# Patient Record
Sex: Female | Born: 1945 | Race: White | Hispanic: No | Marital: Married | Smoking: Never smoker
Health system: Southern US, Community
[De-identification: ages and names within clinical notes are randomized; demographics above are authoritative.]

## PROBLEM LIST (undated history)

## (undated) DIAGNOSIS — E785 Hyperlipidemia, unspecified: Secondary | ICD-10-CM

## (undated) DIAGNOSIS — I2510 Atherosclerotic heart disease of native coronary artery without angina pectoris: Secondary | ICD-10-CM

## (undated) DIAGNOSIS — I10 Essential (primary) hypertension: Secondary | ICD-10-CM

## (undated) DIAGNOSIS — N200 Calculus of kidney: Secondary | ICD-10-CM

## (undated) DIAGNOSIS — Z87442 Personal history of urinary calculi: Secondary | ICD-10-CM

## (undated) DIAGNOSIS — E119 Type 2 diabetes mellitus without complications: Secondary | ICD-10-CM

## (undated) HISTORY — PX: CARDIAC CATHETERIZATION: SHX172

## (undated) HISTORY — PX: LITHOTRIPSY: SUR834

---
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
## 1982-11-17 HISTORY — PX: TUBAL LIGATION: SHX77

## 2014-11-17 DIAGNOSIS — E119 Type 2 diabetes mellitus without complications: Secondary | ICD-10-CM | POA: Insufficient documentation

## 2014-11-17 HISTORY — DX: Type 2 diabetes mellitus without complications: E11.9

## 2019-11-13 ENCOUNTER — Encounter: Payer: Self-pay | Admitting: Cardiology

## 2019-11-13 DIAGNOSIS — E871 Hypo-osmolality and hyponatremia: Secondary | ICD-10-CM | POA: Diagnosis not present

## 2019-11-13 DIAGNOSIS — I340 Nonrheumatic mitral (valve) insufficiency: Secondary | ICD-10-CM | POA: Diagnosis not present

## 2019-11-13 DIAGNOSIS — R509 Fever, unspecified: Secondary | ICD-10-CM | POA: Diagnosis not present

## 2019-11-13 DIAGNOSIS — M6281 Muscle weakness (generalized): Secondary | ICD-10-CM | POA: Diagnosis not present

## 2019-11-13 DIAGNOSIS — E119 Type 2 diabetes mellitus without complications: Secondary | ICD-10-CM | POA: Diagnosis not present

## 2019-11-13 DIAGNOSIS — I214 Non-ST elevation (NSTEMI) myocardial infarction: Secondary | ICD-10-CM | POA: Diagnosis not present

## 2019-11-14 DIAGNOSIS — E871 Hypo-osmolality and hyponatremia: Secondary | ICD-10-CM | POA: Diagnosis not present

## 2019-11-14 DIAGNOSIS — M6281 Muscle weakness (generalized): Secondary | ICD-10-CM | POA: Diagnosis not present

## 2019-11-14 DIAGNOSIS — E785 Hyperlipidemia, unspecified: Secondary | ICD-10-CM | POA: Diagnosis not present

## 2019-11-14 DIAGNOSIS — R778 Other specified abnormalities of plasma proteins: Secondary | ICD-10-CM | POA: Diagnosis not present

## 2019-11-14 DIAGNOSIS — I10 Essential (primary) hypertension: Secondary | ICD-10-CM | POA: Diagnosis not present

## 2019-11-14 DIAGNOSIS — R509 Fever, unspecified: Secondary | ICD-10-CM | POA: Diagnosis not present

## 2019-11-14 DIAGNOSIS — I214 Non-ST elevation (NSTEMI) myocardial infarction: Secondary | ICD-10-CM | POA: Diagnosis not present

## 2019-11-14 DIAGNOSIS — E119 Type 2 diabetes mellitus without complications: Secondary | ICD-10-CM | POA: Diagnosis not present

## 2019-11-15 ENCOUNTER — Ambulatory Visit (HOSPITAL_COMMUNITY)
Admission: AD | Admit: 2019-11-15 | Payer: Self-pay | Source: Other Acute Inpatient Hospital | Admitting: Interventional Cardiology

## 2019-11-15 ENCOUNTER — Encounter (HOSPITAL_COMMUNITY): Payer: Self-pay | Admitting: Family Medicine

## 2019-11-15 ENCOUNTER — Inpatient Hospital Stay (HOSPITAL_COMMUNITY)
Admission: AD | Admit: 2019-11-15 | Discharge: 2019-11-19 | DRG: 871 | Disposition: A | Discharge status: Home Health | Payer: Medicare Other | Source: Other Acute Inpatient Hospital | Attending: Osteopathic Medicine | Admitting: Osteopathic Medicine

## 2019-11-15 DIAGNOSIS — I252 Old myocardial infarction: Secondary | ICD-10-CM

## 2019-11-15 DIAGNOSIS — I214 Non-ST elevation (NSTEMI) myocardial infarction: Secondary | ICD-10-CM

## 2019-11-15 DIAGNOSIS — Z20822 Contact with and (suspected) exposure to covid-19: Secondary | ICD-10-CM | POA: Diagnosis present

## 2019-11-15 DIAGNOSIS — E119 Type 2 diabetes mellitus without complications: Secondary | ICD-10-CM | POA: Diagnosis not present

## 2019-11-15 DIAGNOSIS — A419 Sepsis, unspecified organism: Secondary | ICD-10-CM | POA: Diagnosis not present

## 2019-11-15 DIAGNOSIS — Z8249 Family history of ischemic heart disease and other diseases of the circulatory system: Secondary | ICD-10-CM | POA: Diagnosis not present

## 2019-11-15 DIAGNOSIS — N202 Calculus of kidney with calculus of ureter: Secondary | ICD-10-CM | POA: Diagnosis present

## 2019-11-15 DIAGNOSIS — N136 Pyonephrosis: Secondary | ICD-10-CM | POA: Diagnosis present

## 2019-11-15 DIAGNOSIS — E785 Hyperlipidemia, unspecified: Secondary | ICD-10-CM | POA: Diagnosis present

## 2019-11-15 DIAGNOSIS — I2510 Atherosclerotic heart disease of native coronary artery without angina pectoris: Secondary | ICD-10-CM | POA: Diagnosis not present

## 2019-11-15 DIAGNOSIS — E1169 Type 2 diabetes mellitus with other specified complication: Secondary | ICD-10-CM

## 2019-11-15 DIAGNOSIS — E871 Hypo-osmolality and hyponatremia: Secondary | ICD-10-CM | POA: Diagnosis present

## 2019-11-15 DIAGNOSIS — I10 Essential (primary) hypertension: Secondary | ICD-10-CM | POA: Diagnosis present

## 2019-11-15 DIAGNOSIS — I493 Ventricular premature depolarization: Secondary | ICD-10-CM | POA: Diagnosis present

## 2019-11-15 DIAGNOSIS — M6281 Muscle weakness (generalized): Secondary | ICD-10-CM | POA: Diagnosis not present

## 2019-11-15 DIAGNOSIS — R778 Other specified abnormalities of plasma proteins: Secondary | ICD-10-CM

## 2019-11-15 DIAGNOSIS — R509 Fever, unspecified: Secondary | ICD-10-CM | POA: Diagnosis not present

## 2019-11-15 DIAGNOSIS — N132 Hydronephrosis with renal and ureteral calculous obstruction: Secondary | ICD-10-CM

## 2019-11-15 DIAGNOSIS — N200 Calculus of kidney: Secondary | ICD-10-CM | POA: Diagnosis not present

## 2019-11-15 DIAGNOSIS — R8271 Bacteriuria: Secondary | ICD-10-CM | POA: Diagnosis not present

## 2019-11-15 HISTORY — DX: Type 2 diabetes mellitus with other specified complication: E11.69

## 2019-11-15 HISTORY — DX: Hyperlipidemia, unspecified: E78.5

## 2019-11-15 HISTORY — DX: Sepsis, unspecified organism: A41.9

## 2019-11-15 HISTORY — DX: Hypo-osmolality and hyponatremia: E87.1

## 2019-11-15 HISTORY — DX: Hydronephrosis with renal and ureteral calculous obstruction: N13.2

## 2019-11-15 HISTORY — DX: Calculus of kidney: N20.0

## 2019-11-15 HISTORY — DX: Essential (primary) hypertension: I10

## 2019-11-15 HISTORY — DX: Old myocardial infarction: I25.2

## 2019-11-15 LAB — COMPREHENSIVE METABOLIC PANEL WITH GFR
ALT: 39 U/L (ref 0–44)
AST: 41 U/L (ref 15–41)
Albumin: 1.9 g/dL — ABNORMAL LOW (ref 3.5–5.0)
Alkaline Phosphatase: 70 U/L (ref 38–126)
Anion gap: 7 (ref 5–15)
BUN: 18 mg/dL (ref 8–23)
CO2: 21 mmol/L — ABNORMAL LOW (ref 22–32)
Calcium: 7.9 mg/dL — ABNORMAL LOW (ref 8.9–10.3)
Chloride: 107 mmol/L (ref 98–111)
Creatinine, Ser: 1.05 mg/dL — ABNORMAL HIGH (ref 0.44–1.00)
GFR calc Af Amer: >60 mL/min
GFR calc non Af Amer: 53 mL/min — ABNORMAL LOW
Glucose, Bld: 148 mg/dL — ABNORMAL HIGH (ref 70–99)
Potassium: 4.1 mmol/L (ref 3.5–5.1)
Sodium: 135 mmol/L (ref 135–145)
Total Bilirubin: 0.3 mg/dL (ref 0.3–1.2)
Total Protein: 5.7 g/dL — ABNORMAL LOW (ref 6.5–8.1)

## 2019-11-15 LAB — PROTIME-INR
INR: 1.1 (ref 0.8–1.2)
Prothrombin Time: 14.1 s (ref 11.4–15.2)

## 2019-11-15 LAB — CBC
HCT: 29.7 % — ABNORMAL LOW (ref 36.0–46.0)
Hemoglobin: 9.6 g/dL — ABNORMAL LOW (ref 12.0–15.0)
MCH: 29 pg (ref 26.0–34.0)
MCHC: 32.3 g/dL (ref 30.0–36.0)
MCV: 89.7 fL (ref 80.0–100.0)
Platelets: 329 10*3/uL (ref 150–400)
RBC: 3.31 MIL/uL — ABNORMAL LOW (ref 3.87–5.11)
RDW: 13.2 % (ref 11.5–15.5)
WBC: 11.6 10*3/uL — ABNORMAL HIGH (ref 4.0–10.5)
nRBC: 0 % (ref 0.0–0.2)

## 2019-11-15 LAB — GLUCOSE, CAPILLARY: Glucose-Capillary: 154 mg/dL — ABNORMAL HIGH (ref 70–99)

## 2019-11-15 LAB — HEMOGLOBIN A1C
Hgb A1c MFr Bld: 7.3 % — ABNORMAL HIGH (ref 4.8–5.6)
Mean Plasma Glucose: 162.81 mg/dL

## 2019-11-15 LAB — SARS CORONAVIRUS 2 (TAT 6-24 HRS): SARS Coronavirus 2: NEGATIVE

## 2019-11-15 SURGERY — LEFT HEART CATH AND CORONARY ANGIOGRAPHY
Anesthesia: LOCAL

## 2019-11-15 MED ORDER — SODIUM CHLORIDE 0.9 % IV SOLN: INTRAVENOUS | Status: DC

## 2019-11-15 MED ORDER — ACETAMINOPHEN 650 MG RE SUPP: 650.0000 mg | Freq: Four times a day (QID) | RECTAL | Status: DC | PRN

## 2019-11-15 MED ORDER — POLYETHYLENE GLYCOL 3350 17 G PO PACK: 17.0000 g | PACK | Freq: Every day | ORAL | Status: DC | PRN

## 2019-11-15 MED ORDER — SODIUM CHLORIDE 0.9 % IV SOLN
1.0000 g | INTRAVENOUS | Status: DC
  Administered 2019-11-16 – 2019-11-19 (×4): 1 g via INTRAVENOUS
  Filled 2019-11-15 (×2): qty 10
  Filled 2019-11-15: qty 1
  Filled 2019-11-15: qty 10

## 2019-11-15 MED ORDER — ASPIRIN EC 81 MG PO TBEC
81.0000 mg | DELAYED_RELEASE_TABLET | Freq: Every day | ORAL | Status: DC
  Administered 2019-11-15 – 2019-11-19 (×5): 81 mg via ORAL
  Filled 2019-11-15 (×5): qty 1

## 2019-11-15 MED ORDER — INSULIN ASPART 100 UNIT/ML ~~LOC~~ SOLN
0.0000 [IU] | Freq: Three times a day (TID) | SUBCUTANEOUS | Status: DC
  Administered 2019-11-16 (×2): 1 [IU] via SUBCUTANEOUS
  Administered 2019-11-17: 2 [IU] via SUBCUTANEOUS
  Administered 2019-11-17 – 2019-11-18 (×2): 1 [IU] via SUBCUTANEOUS
  Administered 2019-11-18 – 2019-11-19 (×2): 3 [IU] via SUBCUTANEOUS
  Administered 2019-11-19: 1 [IU] via SUBCUTANEOUS

## 2019-11-15 MED ORDER — ONDANSETRON HCL 4 MG/2ML IJ SOLN: 4.0000 mg | Freq: Four times a day (QID) | INTRAMUSCULAR | Status: DC | PRN

## 2019-11-15 MED ORDER — ACETAMINOPHEN 325 MG PO TABS
650.0000 mg | ORAL_TABLET | Freq: Four times a day (QID) | ORAL | Status: DC | PRN
  Administered 2019-11-15 – 2019-11-16 (×2): 650 mg via ORAL
  Filled 2019-11-15 (×2): qty 2

## 2019-11-15 MED ORDER — ONDANSETRON HCL 4 MG PO TABS: 4.0000 mg | ORAL_TABLET | Freq: Four times a day (QID) | ORAL | Status: DC | PRN

## 2019-11-15 MED ORDER — HEPARIN (PORCINE) 25000 UT/250ML-% IV SOLN
1200.0000 [IU]/h | INTRAVENOUS | Status: DC
  Administered 2019-11-15: 1000 [IU]/h via INTRAVENOUS
  Filled 2019-11-15: qty 250

## 2019-11-15 MED ORDER — ATORVASTATIN CALCIUM 10 MG PO TABS
20.0000 mg | ORAL_TABLET | Freq: Every day | ORAL | Status: DC
  Administered 2019-11-16: 20 mg via ORAL
  Filled 2019-11-15: qty 2

## 2019-11-15 NOTE — H&P (Addendum)
History and Physical    Claudia Christian GTX:[id number] DOB: 1945-12-24 DOA: 11/15/2019  PCP: Patient, No Pcp Per Patient coming from: [facility]  Chief Complaint: NSTEMI  HPI: Claudia Christian is a 73 y.o. female with medical history significant of diabetes, hypertension, hyperlipidemia, nephrolithiasis status post stenting and lithotripsy.  Patient presented initially to the outside hospital secondary to significant weakness and fatigue associated with fever.  Symptoms started about 3 days ago.  Patient had her husband help her in normal was called for transfer to the emergency department for persistent significant weakness.  She reports having a fever up to 104F.  Outside hospital course (personally reviewed): Patient was admitted for concern for sepsis in the setting of unknown source.  Patient was tested for Covid 19 twice with negative results.  Patient also found to have elevated troponin while inpatient concerning for NSTEMI.  CTA chest was negative for PE and significant for CAD.  Patient also noted to have small bilateral pleural effusions with possible Eraxis of right lung base. Patient underwent CT urogram which was significant for moderate to severe left-sided hydro and ureteral nephrosis secondary to obstructing 1 x 0.6 cm stone at the proximal left ureter with additional nonobstructing stones seen in the lower pole of the left kidney measuring up to 1.7 cm. Patient on Ceftriaxone  Labs dated 11/14/19 significant for: CBC: WBC 11.8, hemoglobin 9.8, hematocrit 29.4, platelets 271 BMP: Sodium 126, potassium 3.2, chloride 99, CO2 18, BUN 15, creatinine 1.0, glucose 175 Troponin trend of 2.02 at 8:20 AM, troponin of 1.69 at 12:20 PM, troponin of 1.37 at p.m. Osmolality of 248, calcium of 7.9, LDL cholesterol of 39.8, HDL of 14, INR 1.2, procalcitonin of 0.41  Patient medications include: Enalapril 20 mg daily, sertraline 50 mg every morning, Crestor 10 mg nightly, metformin 1000  mg p.o. twice daily, Januvia 25 mg p.o. daily, alendronate sodium 70 mg p.o. Thursday  Review of Systems: Review of Systems  Constitutional: Positive for fever and malaise/fatigue.  Respiratory: Positive for shortness of breath. Negative for cough and sputum production.   Cardiovascular: Negative for chest pain and palpitations.  Gastrointestinal: Positive for diarrhea. Negative for constipation, nausea and vomiting.  Neurological: Positive for weakness.  All other systems reviewed and are negative.   Past Medical History:  Diagnosis Date  . Diabetes mellitus type 2, controlled (HCC)   . Essential hypertension   . Hyperlipidemia   . Nephrolithiasis     Past Surgical History:  Procedure Laterality Date  . LITHOTRIPSY    . TUBAL LIGATION  1984     reports that she has never smoked. She has never used smokeless tobacco. She reports that she does not drink alcohol or use drugs.  No Known Allergies  Family History  Problem Relation Age of Onset  . Heart attack Mother   . Heart attack Father   . Heart attack Brother    Prior to Admission medications   Not on File    Physical Exam:  Physical Exam Constitutional:      General: She is not in acute distress.    Appearance: She is well-developed. She is not diaphoretic.  Eyes:     Conjunctiva/sclera: Conjunctivae normal.     Pupils: Pupils are equal, round, and reactive to light.  Cardiovascular:     Rate and Rhythm: Normal rate and regular rhythm.     Heart sounds: Normal heart sounds. No murmur.  Pulmonary:     Effort: Pulmonary effort is normal. No respiratory distress.  Breath sounds: Normal breath sounds. No wheezing or rales.  Abdominal:     General: Bowel sounds are normal. There is no distension.     Palpations: Abdomen is soft.     Tenderness: There is no abdominal tenderness. There is no right CVA tenderness, left CVA tenderness, guarding or rebound.  Musculoskeletal:        General: No tenderness. Normal  range of motion.     Cervical back: Normal range of motion.  Lymphadenopathy:     Cervical: No cervical adenopathy.  Skin:    General: Skin is warm and dry.  Neurological:     Mental Status: She is alert and oriented to person, place, and time.     Labs on Admission: I have personally reviewed following labs and imaging studies  CBC: No results for input(s): WBC, NEUTROABS, HGB, HCT, MCV, PLT in the last 168 hours.  Basic Metabolic Panel: No results for input(s): NA, K, CL, CO2, GLUCOSE, BUN, CREATININE, CALCIUM, MG, PHOS in the last 168 hours.  GFR: CrCl cannot be calculated (No successful lab value found.).  Liver Function Tests: No results for input(s): AST, ALT, ALKPHOS, BILITOT, PROT, ALBUMIN in the last 168 hours. No results for input(s): LIPASE, AMYLASE in the last 168 hours. No results for input(s): AMMONIA in the last 168 hours.  Coagulation Profile: No results for input(s): INR, PROTIME in the last 168 hours.  Cardiac Enzymes: No results for input(s): CKTOTAL, CKMB, CKMBINDEX, TROPONINI in the last 168 hours.  BNP (last 3 results) No results for input(s): PROBNP in the last 8760 hours.  HbA1C: No results for input(s): HGBA1C in the last 72 hours.  CBG: No results for input(s): GLUCAP in the last 168 hours.  Lipid Profile: No results for input(s): CHOL, HDL, LDLCALC, TRIG, CHOLHDL, LDLDIRECT in the last 72 hours.  Thyroid Function Tests: No results for input(s): TSH, T4TOTAL, FREET4, T3FREE, THYROIDAB in the last 72 hours.  Anemia Panel: No results for input(s): VITAMINB12, FOLATE, FERRITIN, TIBC, IRON, RETICCTPCT in the last 72 hours.  Urine analysis: No results found for: COLORURINE, APPEARANCEUR, LABSPEC, PHURINE, GLUCOSEU, HGBUR, BILIRUBINUR, KETONESUR, PROTEINUR, UROBILINOGEN, NITRITE, LEUKOCYTESUR   Radiological Exams on Admission: No results found.  Assessment/Plan Principal Problem:   Sepsis ([place]) Active Problems:   Hydronephrosis with  renal and ureteral calculus obstruction   Hyponatremia   NSTEMI (non-ST elevated myocardial infarction) ([place])   Type 2 diabetes mellitus with hyperlipidemia ([place])   Sepsis Present on admission from outside hospital.  Physiology is improved.  Initially without source but now concerned to have evidence of possible abscess.  Last temperature of 98.1 F prior to arrival 3 months from hospital. Blood cultures no growth to date. COVID-19 tested twice with negative results. -Continue Ceftriaxone IV  NSTEMI No associated chest pain.  Peak troponin I of 2.02.  EKG from outside hospital significant for nonspecific T wave changes located in the anterior leads.  PVCs noted. [facility]iology at [facility] recommended possible heart catheterization.  Cardiology was consulted and will see during admission.  Patient was started on heparin drip. seen  -Consult cardiology: Transthoracic Echocardiogram and will see -Continue heparin drip  Hydronephrosis L . obstructing nephrolithiasis Patient has a history of obstructive nephrolithiasis requiring stent and lithotripsy. Has a urologist in [place]. Consulted urology who recommends nephrostomy tube -IR consult placed -NPO  Hyponatremia Unknown etiology.  Patient was being treated with normal saline at outside hospital.  Last sodium of 126.  Associated low serum osmolality. -Resume normal saline, check  BMP this evening and tomorrow morning  Diabetes mellitus, type 2 with hyperlipidemia Patient is on metformin as an outpatient. -Hold Metformin -Sliding scale insulin while inpatient  Generalized weakness -Physical therapy consult   DVT prophylaxis: Heparin drip Code Status: Full code Family Communication: None at bedside Disposition Plan: Discharge pending continued work-up and management Consults called: Urology, cardiology Admission status: Inpatient   Jacquelin Hawking, MD [facility] 11/15/2019, 5:17 PM

## 2019-11-15 NOTE — Progress Notes (Signed)
Received from [facility] via ambulance. Monitor placed and verified. Oriented to room and unit. Call bell and phone in reach. Hospitalist to see.

## 2019-11-15 NOTE — Consult Note (Signed)
Reason for Consult Large Left Ureteral + Partial Staghorn Claudia Christian  Referring Physician: Jodean Lima MD  Claudia Christian is an 73 y.o. female.   HPI:   1 - Recurrent Urolithiasis -  Pre 2020 - URS x 1, SWL x 1 in [place]  10/2019 Large Left Ureteral + Partial Staghorn Stone - 1cm left UPJ + 3cm lower pole stones (1100HU) by CT at [facility] 11/14/19.   2 -  Bacteruria  - bacteruria with some concern of mild pyelo per report from referring hospital. Placed in empiric rocephin. No CX data for review. She is without high fevers, non-tachycardic here.  PMH sig for BTL, DM2. NO baseline functional limitations from health, independent in all ADL's lives with husband.   Today " Claudia Christian " is seen in consultation for above. She was transferred here from [place] without any a priori input from Urology. Also some question of NSTEMI.    Past Medical History:  Diagnosis Date  . Diabetes mellitus type 2, controlled (HCC)   . Essential hypertension   . Hyperlipidemia   . Nephrolithiasis     Past Surgical History:  Procedure Laterality Date  . LITHOTRIPSY    . TUBAL LIGATION  1984    Family History  Problem Relation Age of Onset  . Heart attack Mother   . Heart attack Father   . Heart attack Brother     Social History:  reports that she has never smoked. She has never used smokeless tobacco. She reports that she does not drink alcohol or use drugs.  Allergies: No Known Allergies  Medications: I have reviewed the patient's current medications.  No results found for this or any previous visit (from the past 48 hour(s)).  No results found.  Review of Systems  Constitutional: Positive for chills and fever.  HENT: Negative.   Eyes: Negative.   Respiratory: Negative.   Cardiovascular: Negative.   Gastrointestinal: Negative.   Endocrine: Negative.   Genitourinary: Negative.   Musculoskeletal: Negative.   Allergic/Immunologic: Negative.   Neurological: Negative.    Hematological: Negative.   Psychiatric/Behavioral: Negative.    Blood pressure 129/74, pulse 85, temperature 99.3 F (37.4 C), temperature source Oral, resp. rate 16, height 5\' 4"  (1.626 m), weight 74.3 kg, SpO2 99 %. Physical Exam  Constitutional: She is oriented to person, place, and time. She appears well-developed.  Very pleasant. NO distress at all.   HENT:  Head: Normocephalic.  Eyes: Pupils are equal, round, and reactive to light.  Cardiovascular: Normal rate.  Respiratory: Effort normal.  GI: Soft.  Genitourinary:    Genitourinary Comments: NO CVAT at present.    Musculoskeletal:        General: Normal range of motion.     Cervical back: Normal range of motion.  Neurological: She is alert and oriented to person, place, and time.  Skin: Skin is warm.  Psychiatric: She has a normal mood and affect.    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   Have discussed rec with pt and primary team. Greatly appreciate hospitalist comanagment.   Pt voiced understanding and agreement with plan.  2 -  Bacteruria - agree with current rocephin pending furhter CX data. She has virtually no SIRS criteria and is not septic.    11/15/2019, 6:21 PM

## 2019-11-15 NOTE — Progress Notes (Signed)
ANTICOAGULATION CONSULT NOTE - Initial Consult  Pharmacy Consult for heparin Indication: chest pain/ACS  No Known Allergies  Patient Measurements: Height: 5\' 4"  (162.6 cm) Weight: 163 lb 11.2 oz (74.3 kg) IBW/kg (Calculated) : 54.7 Heparin Dosing Weight: 70kg Vital Signs: Temp: 99.3 F (37.4 C) (12/29 1713) Temp Source: Oral (12/29 1713) BP: 129/74 (12/29 1713) Pulse Rate: 85 (12/29 1713)  Labs: No results for input(s): HGB, HCT, PLT, APTT, LABPROT, INR, HEPARINUNFRC, HEPRLOWMOCWT, CREATININE, CKTOTAL, CKMB, TROPONINIHS in the last 72 hours.  CrCl cannot be calculated (No successful lab value found.).   Medical History: Past Medical History:  Diagnosis Date  . Diabetes mellitus type 2, controlled ([place])   . Essential hypertension   . Hyperlipidemia   . Nephrolithiasis     Assessment: 73 year old female transferred from [facility] hospital to [facility] for further evaluation including IR, cardiology, and urology.   Patient with elevated cardiac enzymes at [facility], she was receiving full dose lovenox, appears last dose was this morning. New orders received to transition to IV heparin, will start tonight without bolus.   Hgb 9.8, plt 271 Does not appear that she was on anticoagulation prior to admit.   Goal of Therapy:  Heparin level 0.3-0.7 units/ml Monitor platelets by anticoagulation protocol: Yes   Plan:  Start heparin infusion at 1000 units/hr Check anti-Xa level in 8 hours and daily while on heparin Continue to monitor H&H and platelets   Erin Hearing PharmD., BCPS Clinical Pharmacist 11/15/2019 6:12 PM

## 2019-11-15 NOTE — Consult Note (Signed)
Cardiology Consultation:   Patient ID: Claudia Christian MRN: [id number]; DOB: 1946-02-27  Admit date: 11/15/2019 Date of Consult: 11/15/2019  Primary Care Provider: Patient, No Pcp Per Primary Cardiologist: New Primary Electrophysiologist:  None    Patient Profile:   Claudia Christian is a 73 y.o. female with a hx of  DM2, HTN, HL, kidney stones with prior interventions admitted to [facility] with weakness and fatigue, fever.Reported home temps 104 who is being seen today for the evaluation of elevated troponin at the request of Dr Lonny Prude.  History of Present Illness:   [name] 73 yo female history of DM2, HTN, HL, kidney stones with prior interventions admitted to [facility] with weakness and fatigue, fever.Reported home temps 104. She denies any chest pain. On my history she denies any SOB. Said she was feeling well up until Christmas, then progressively started developing significant fatigue, weakness, and fevers.   At OSH negative COVID testing.  CT PE negative for PE, +coronary atherosclerosis CT urogram: moderate to severe left hydronephrosis and ureteral nephrosis due to obstructing stone EKG SR, no acute ischemic changes Echo 11/13/19 [place]: LVEF 27-25%, grade I diastolic dysfunction, apical lateral wall is hypokinetic,   WBC 11.8 Hgb 9.8 Plt 271 Na 126 K 3.2 BUN 15 Cr 1  Trop 2.02-->1.69-->1.37    Heart Pathway Score:     Past Medical History:  Diagnosis Date  . Diabetes mellitus type 2, controlled ([place])   . Essential hypertension   . Hyperlipidemia   . Nephrolithiasis     Past Surgical History:  Procedure Laterality Date  . LITHOTRIPSY    . TUBAL LIGATION  1984      Inpatient Medications: Scheduled Meds: . [START ON 11/16/2019] insulin aspart  0-9 Units Subcutaneous TID WC   Continuous Infusions: . sodium chloride    . [START ON 11/16/2019] cefTRIAXone (ROCEPHIN)  IV    . heparin     PRN Meds: acetaminophen **OR** acetaminophen,  ondansetron **OR** ondansetron (ZOFRAN) IV, polyethylene glycol  Allergies:   No Known Allergies  Social History:   Social History   Socioeconomic History  . Marital status: Married    Spouse name: Not on file  . Number of children: Not on file  . Years of education: Not on file  . Highest education level: Not on file  Occupational History  . Not on file  Tobacco Use  . Smoking status: Never Smoker  . Smokeless tobacco: Never Used  Substance and Sexual Activity  . Alcohol use: Never  . Drug use: Never  . Sexual activity: Yes    Partners: Male  Other Topics Concern  . Not on file  Social History Narrative  . Not on file   Social Determinants of Health   Financial Resource Strain:   . Difficulty of Paying Living Expenses: Not on file  Food Insecurity:   . Worried About Charity fundraiser in the Last Year: Not on file  . Ran Out of Food in the Last Year: Not on file  Transportation Needs:   . Lack of Transportation (Medical): Not on file  . Lack of Transportation (Non-Medical): Not on file  Physical Activity:   . Days of Exercise per Week: Not on file  . Minutes of Exercise per Session: Not on file  Stress:   . Feeling of Stress : Not on file  Social Connections:   . Frequency of Communication with Friends and Family: Not on file  . Frequency of Social Gatherings with Friends and  Family: Not on file  . Attends Religious Services: Not on file  . Active Member of Clubs or Organizations: Not on file  . Attends Banker Meetings: Not on file  . Marital Status: Not on file  Intimate Partner Violence:   . Fear of Current or Ex-Partner: Not on file  . Emotionally Abused: Not on file  . Physically Abused: Not on file  . Sexually Abused: Not on file    Family History:    Family History  Problem Relation Age of Onset  . Heart attack Mother   . Heart attack Father   . Heart attack Brother      ROS:  Please see the history of present illness.  All  other ROS reviewed and negative.     Physical Exam/Data:   Vitals:   11/15/19 1713  BP: 129/74  Pulse: 85  Resp: 16  Temp: 99.3 F (37.4 C)  TempSrc: Oral  SpO2: 99%  Weight: 74.3 kg  Height: 5\' 4"  (1.626 m)   No intake or output data in the 24 hours ending 11/15/19 1839 Last 3 Weights 11/15/2019  Weight (lbs) 163 lb 11.2 oz  Weight (kg) 74.254 kg     Body mass index is 28.1 kg/m.  General:  Well nourished, well developed, in no acute distress HEENT: normal Lymph: no adenopathy Neck: no JVD Endocrine:  No thryomegaly Cardiac:  normal S1, S2; RRR; no murmur  Lungs:  clear to auscultation bilaterally, no wheezing, rhonchi or rales  Abd: soft, nontender, no hepatomegaly  Ext: no edema Musculoskeletal:  No deformities, BUE and BLE strength normal and equal Skin: warm and dry  Neuro:  CNs 2-12 intact, no focal abnormalities noted Psych:  Normal affect     Laboratory Data:  High Sensitivity Troponin:  No results for input(s): TROPONINIHS in the last 720 hours.   ChemistryNo results for input(s): NA, K, CL, CO2, GLUCOSE, BUN, CREATININE, CALCIUM, GFRNONAA, GFRAA, ANIONGAP in the last 168 hours.  No results for input(s): PROT, ALBUMIN, AST, ALT, ALKPHOS, BILITOT in the last 168 hours. HematologyNo results for input(s): WBC, RBC, HGB, HCT, MCV, MCH, MCHC, RDW, PLT in the last 168 hours. BNPNo results for input(s): BNP, PROBNP in the last 168 hours.  DDimer No results for input(s): DDIMER in the last 168 hours.   Radiology/Studies:  No results found. {   Assessment and Plan:   1. Sepsis - per primary team, from notes unclear source   2. Hydronephrosis - from H&P urology is planning a nephrostomy tube  3. Elevated troponin -difficult interpretation of elevated troponin in setting of sepsis without any cardiopulmonary symptoms or context. Physically active at home prior to current illness, denies any SOB or chest pain, admitted with fever and fatigue/weakness.  -  elevated peak 2 by standard trop, trending down. - CT PE showed some coronary calcifications, by echo report normal LVEF but apical lateral hypokinesis. EKG without specific ischemic changes. No specific symptoms.   -  Patient requires nephrostomy tube placement, much higher priority than consideration cath at this time. Needs further resolution of her sepsis - potential cath vs noninvasive evaluation once other acute medical issues are improved. Given absence of cardiopulmonary symptoms likely would plan for noninvasive testing.  - continue medical therapy with ASA, hep gtt, low dose beta blocker. No ACE given urinary obstruction and risk for AKI.      For questions or updates, please contact [facility] Please consult [email or web address] for contact info under  Joanie Coddington, MD  11/15/2019 6:39 PM

## 2019-11-16 ENCOUNTER — Inpatient Hospital Stay (HOSPITAL_COMMUNITY): Payer: Medicare Other

## 2019-11-16 ENCOUNTER — Other Ambulatory Visit: Payer: Self-pay

## 2019-11-16 ENCOUNTER — Other Ambulatory Visit (HOSPITAL_COMMUNITY): Payer: Medicare Other

## 2019-11-16 DIAGNOSIS — N200 Calculus of kidney: Secondary | ICD-10-CM

## 2019-11-16 HISTORY — PX: IR NEPHROSTOMY PLACEMENT LEFT: IMG6063

## 2019-11-16 LAB — CBC
HCT: 29.9 % — ABNORMAL LOW (ref 36.0–46.0)
Hemoglobin: 9.8 g/dL — ABNORMAL LOW (ref 12.0–15.0)
MCH: 29.3 pg (ref 26.0–34.0)
MCHC: 32.8 g/dL (ref 30.0–36.0)
MCV: 89.5 fL (ref 80.0–100.0)
Platelets: 311 10*3/uL (ref 150–400)
RBC: 3.34 MIL/uL — ABNORMAL LOW (ref 3.87–5.11)
RDW: 13.2 % (ref 11.5–15.5)
WBC: 10.6 10*3/uL — ABNORMAL HIGH (ref 4.0–10.5)
nRBC: 0 % (ref 0.0–0.2)

## 2019-11-16 LAB — BASIC METABOLIC PANEL
Anion gap: 7 (ref 5–15)
BUN: 15 mg/dL (ref 8–23)
CO2: 22 mmol/L (ref 22–32)
Calcium: 8.1 mg/dL — ABNORMAL LOW (ref 8.9–10.3)
Chloride: 109 mmol/L (ref 98–111)
Creatinine, Ser: 1.05 mg/dL — ABNORMAL HIGH (ref 0.44–1.00)
GFR calc Af Amer: >60 mL/min (ref >60)
GFR calc non Af Amer: 53 mL/min — ABNORMAL LOW (ref >60)
Glucose, Bld: 186 mg/dL — ABNORMAL HIGH (ref 70–99)
Potassium: 4.3 mmol/L (ref 3.5–5.1)
Sodium: 138 mmol/L (ref 135–145)

## 2019-11-16 LAB — GLUCOSE, CAPILLARY
Glucose-Capillary: 134 mg/dL — ABNORMAL HIGH (ref 70–99)
Glucose-Capillary: 124 mg/dL — ABNORMAL HIGH (ref 70–99)
Glucose-Capillary: 268 mg/dL — ABNORMAL HIGH (ref 70–99)
Glucose-Capillary: 162 mg/dL — ABNORMAL HIGH (ref 70–99)

## 2019-11-16 LAB — TROPONIN I (HIGH SENSITIVITY)
Troponin I (High Sensitivity): 519 ng/L (ref <18)
Troponin I (High Sensitivity): 382 ng/L (ref <18)

## 2019-11-16 LAB — HEPARIN LEVEL (UNFRACTIONATED)
Heparin Unfractionated: 0.15 IU/mL — ABNORMAL LOW (ref 0.30–0.70)
Heparin Unfractionated: <0.1 IU/mL — ABNORMAL LOW (ref 0.30–0.70)

## 2019-11-16 MED ORDER — SODIUM CHLORIDE 0.9% FLUSH
5.0000 mL | Freq: Three times a day (TID) | INTRAVENOUS | Status: DC
  Administered 2019-11-16 – 2019-11-19 (×8): 5 mL

## 2019-11-16 MED ORDER — FENTANYL CITRATE (PF) 100 MCG/2ML IJ SOLN
INTRAMUSCULAR | Status: AC | PRN
  Administered 2019-11-16: 50 ug via INTRAVENOUS

## 2019-11-16 MED ORDER — MIDAZOLAM HCL 2 MG/2ML IJ SOLN
INTRAMUSCULAR | Status: AC
  Filled 2019-11-16: qty 2

## 2019-11-16 MED ORDER — ENALAPRIL MALEATE 20 MG PO TABS
20.0000 mg | ORAL_TABLET | Freq: Every day | ORAL | Status: DC
  Administered 2019-11-16 – 2019-11-19 (×4): 20 mg via ORAL
  Filled 2019-11-16 (×4): qty 1

## 2019-11-16 MED ORDER — HEPARIN BOLUS VIA INFUSION
2000.0000 [IU] | Freq: Once | INTRAVENOUS | Status: AC
  Administered 2019-11-16: 2000 [IU] via INTRAVENOUS
  Filled 2019-11-16: qty 2000

## 2019-11-16 MED ORDER — LIDOCAINE HCL 1 % IJ SOLN
INTRAMUSCULAR | Status: AC
  Filled 2019-11-16: qty 20

## 2019-11-16 MED ORDER — METOPROLOL TARTRATE 5 MG/5ML IV SOLN: 5.0000 mg | Freq: Four times a day (QID) | INTRAVENOUS | Status: DC | PRN

## 2019-11-16 MED ORDER — FUROSEMIDE 10 MG/ML IJ SOLN
20.0000 mg | Freq: Once | INTRAMUSCULAR | Status: AC
  Administered 2019-11-16: 20 mg via INTRAVENOUS
  Filled 2019-11-16: qty 2

## 2019-11-16 MED ORDER — HEPARIN (PORCINE) 25000 UT/250ML-% IV SOLN
2100.0000 [IU]/h | INTRAVENOUS | Status: DC
  Administered 2019-11-16: 1200 [IU]/h via INTRAVENOUS
  Administered 2019-11-17: 1700 [IU]/h via INTRAVENOUS
  Filled 2019-11-16 (×3): qty 250

## 2019-11-16 MED ORDER — MIDAZOLAM HCL 2 MG/2ML IJ SOLN
INTRAMUSCULAR | Status: AC | PRN
  Administered 2019-11-16: 1 mg via INTRAVENOUS

## 2019-11-16 MED ORDER — FENTANYL CITRATE (PF) 100 MCG/2ML IJ SOLN
INTRAMUSCULAR | Status: AC
  Filled 2019-11-16: qty 2

## 2019-11-16 MED ORDER — IOHEXOL 300 MG/ML  SOLN
50.0000 mL | Freq: Once | INTRAMUSCULAR | Status: AC | PRN
  Administered 2019-11-16: 5 mL

## 2019-11-16 NOTE — Progress Notes (Signed)
CRITICAL VALUE ALERT  Critical Value:  Troponin 519  Date & Time Notied:  11/16/19 0945hrs  Provider Notified: Kathlen Mody PA  Orders Received/Actions taken: no new orders at this time.

## 2019-11-16 NOTE — Consult Note (Signed)
Chief Complaint: Patient was seen in consultation today for left nephrostomy placement.   Referring Physician(s): Dr. Maurie Boettcher. Lonny Prude  Supervising Physician: Daryll Brod  Patient Status: [facility] - In-pt  History of Present Illness: Claudia Christian is a 73 y.o. female with a past medical history significant for HLD, HTN, DM and nephrolithiasis s/p stenting/lithotripsy who presented to [facility] ED on 12/28 with complaints of weakness, fatigue and fever x 3 days.  Initial workup notable for leukocytosis, hgb 9.8, creatinine 1.0, troponin 2.02 >> 1.69 >> 1.37, bacteruria, negative Covid, negative CTA for PE and CT urogram positive for moderate to severe left sided hydronephrosis and ureteral nephrosis secondary to obstructing 1 x 0.6 cm stone at the proximal left ureter with additional non obstructing stones seen in the lower pole of the left kidney. She was transferred to [facility] for further management and started on Ceftriaxone. Urology was consulted who has recommended left PCN placement for decompression with plans to proceed with staged percutaneous surgery per Dr. Tresa Moore.   Patient seen in her room today sitting on the edge of the bed, she is asking to sit in the chair so she can look out the window. She reports severe fatigue and weakness still, however it has improved a little bit since she first arrived. She denies any significant back/flank/abdominal pain currently. She denies hematuria or dysuria. She endorses some dyspnea when she lays flat which began when she started to feel sick around Christmas, she denies chest pain. She states that Dr. Tresa Moore explained the PCN to her yesterday and she is agreeable to proceed.   Past Medical History:  Diagnosis Date  . Diabetes mellitus type 2, controlled ([place])   . Essential hypertension   . Hyperlipidemia   . Nephrolithiasis     Past Surgical History:  Procedure Laterality Date  . LITHOTRIPSY    . TUBAL LIGATION  1984    Allergies: Patient has no  known allergies.  Medications: Prior to Admission medications   Not on File     Family History  Problem Relation Age of Onset  . Heart attack Mother   . Heart attack Father   . Heart attack Brother     Social History   Socioeconomic History  . Marital status: Married    Spouse name: Not on file  . Number of children: Not on file  . Years of education: Not on file  . Highest education level: Not on file  Occupational History  . Not on file  Tobacco Use  . Smoking status: Never Smoker  . Smokeless tobacco: Never Used  Substance and Sexual Activity  . Alcohol use: Never  . Drug use: Never  . Sexual activity: Yes    Partners: Male  Other Topics Concern  . Not on file  Social History Narrative  . Not on file   Social Determinants of Health   Financial Resource Strain:   . Difficulty of Paying Living Expenses: Not on file  Food Insecurity:   . Worried About Charity fundraiser in the Last Year: Not on file  . Ran Out of Food in the Last Year: Not on file  Transportation Needs:   . Lack of Transportation (Medical): Not on file  . Lack of Transportation (Non-Medical): Not on file  Physical Activity:   . Days of Exercise per Week: Not on file  . Minutes of Exercise per Session: Not on file  Stress:   . Feeling of Stress : Not on file  Social Connections:   .  Frequency of Communication with Friends and Family: Not on file  . Frequency of Social Gatherings with Friends and Family: Not on file  . Attends Religious Services: Not on file  . Active Member of Clubs or Organizations: Not on file  . Attends Banker Meetings: Not on file  . Marital Status: Not on file     Review of Systems: A 12 point ROS discussed and pertinent positives are indicated in the HPI above.  All other systems are negative.  Review of Systems  Constitutional: Positive for activity change, chills, fatigue and fever.  Respiratory: Positive for shortness of breath ("sometimes when  I lay flat"). Negative for cough.   Cardiovascular: Negative for chest pain.  Gastrointestinal: Negative for abdominal pain, blood in stool, diarrhea, nausea and vomiting.  Genitourinary: Negative for dysuria, flank pain and hematuria.  Musculoskeletal: Negative for back pain.  Skin: Negative for color change and rash.  Neurological: Positive for weakness. Negative for dizziness and headaches.    Vital Signs: BP (!) 147/77 (BP Location: Left Arm)   Pulse 89   Temp 99.5 F (37.5 C) (Oral)   Resp 20   Ht 5\' 4"  (1.626 m)   Wt 162 lb 0.6 oz (73.5 kg)   SpO2 99%   BMI 27.81 kg/m   Physical Exam Vitals and nursing note reviewed.  Constitutional:      General: She is not in acute distress.    Appearance: She is ill-appearing.     Comments: Very pleasant, talkative, good historian. Ambulated to chair with assistance during exam.   HENT:     Head: Normocephalic.     Mouth/Throat:     Mouth: Mucous membranes are moist.     Pharynx: Oropharynx is clear. No oropharyngeal exudate or posterior oropharyngeal erythema.  Cardiovascular:     Rate and Rhythm: Normal rate and regular rhythm.  Pulmonary:     Effort: Pulmonary effort is normal.     Breath sounds: Normal breath sounds.  Abdominal:     General: There is no distension.     Palpations: Abdomen is soft.     Tenderness: There is no abdominal tenderness. There is no right CVA tenderness or left CVA tenderness.  Skin:    General: Skin is warm and dry.  Neurological:     Mental Status: She is alert and oriented to person, place, and time.  Psychiatric:        Mood and Affect: Mood normal.        Behavior: Behavior normal.        Thought Content: Thought content normal.        Judgment: Judgment normal.      MD Evaluation Airway: WNL Heart: WNL Abdomen: WNL Chest/ Lungs: WNL ASA  Classification: 2 Mallampati/Airway Score: One   Imaging: No results found.  Labs:  CBC: Recent Labs    11/15/19 1834 11/16/19 0403    WBC 11.6* 10.6*  HGB 9.6* 9.8*  HCT 29.7* 29.9*  PLT 329 311    COAGS: Recent Labs    11/15/19 1834  INR 1.1    BMP: Recent Labs    11/15/19 1834 11/16/19 0403  NA 135 138  K 4.1 4.3  CL 107 109  CO2 21* 22  GLUCOSE 148* 186*  BUN 18 15  CALCIUM 7.9* 8.1*  CREATININE 1.05* 1.05*  GFRNONAA 53* 53*  GFRAA >60 >60    LIVER FUNCTION TESTS: Recent Labs    11/15/19 1834  BILITOT 0.3  AST  41  ALT 39  ALKPHOS 70  PROT 5.7*  ALBUMIN 1.9*    TUMOR MARKERS: No results for input(s): AFPTM, CEA, CA199, CHROMGRNA in the last 8760 hours.  Assessment and Plan:  73 y/o F who presented to [facility] ED on 12/28 with complaints of weakness, fatigue and fever x 3 days. Work up showed moderate to severe left sided hydronephrosis and ureteral nephrosis secondary to obstructing 1 x 0.6 cm stone at the proximal left ureter with additional non obstructing stones seen in the lower pole of the left kidney. She was transferred to [facility] [place] and urology was consulted who as recommended that IR place a left PCN for decompression with plans for outpatient staged percutaneous surgery per Dr. Berneice Heinrich.   Patient has been NPO since midnight, currently on heparin gtt which was held starting at 0810 this morning. Afebrile, WBC 10.6. hgb 9.8, plt 311, creatinine 1.05, INR 1.1. Currently receiving Ceftriaxone.   Risks and benefits of left PCN placement was discussed with the patient including, but not limited to, infection, bleeding, significant bleeding causing loss or decrease in renal function or damage to adjacent structures.   All of the patient's questions were answered, patient is agreeable to proceed.  Consent signed and in chart.  Thank you for this interesting consult.  I greatly enjoyed meeting Claudia Christian and look forward to participating in their care.  A copy of this report was sent to the requesting provider on this date.  Electronically Signed: Villa Herb, PA-C 11/16/2019, 10:33  AM   I spent a total of 40 Minutes in face to face in clinical consultation, greater than 50% of which was counseling/coordinating care for left PCN placement.

## 2019-11-16 NOTE — Progress Notes (Signed)
[place] for heparin Indication: chest pain/ACS  No Known Allergies  Patient Measurements: Height: 5\' 4"  (162.6 cm) Weight: 162 lb 0.6 oz (73.5 kg) IBW/kg (Calculated) : 54.7 Heparin Dosing Weight: 70kg Vital Signs: Temp: 98.5 F (36.9 C) (12/30 1314) Temp Source: Oral (12/30 1314) BP: 167/84 (12/30 1314) Pulse Rate: 104 (12/30 1314)  Labs: Recent Labs    11/15/19 1834 11/16/19 0403 11/16/19 0825  HGB 9.6* 9.8*  --   HCT 29.7* 29.9*  --   PLT 329 311  --   LABPROT 14.1  --   --   INR 1.1  --   --   HEPARINUNFRC  --  0.15*  --   CREATININE 1.05* 1.05*  --   TROPONINIHS  --   --  519*    Estimated Creatinine Clearance: 48.3 mL/min (A) (by C-G formula based on SCr of 1.05 mg/dL (H)).   Medical History: Past Medical History:  Diagnosis Date  . Diabetes mellitus type 2, controlled ([place])   . Essential hypertension   . Hyperlipidemia   . Nephrolithiasis     Assessment: 73 year old female transferred from [facility] hospital to [facility] for further evaluation including IR, cardiology, and urology.   Patient with elevated cardiac enzymes at [facility], she was receiving full dose lovenox, appears last dose was this morning. New orders received to transition to IV heparin, will start tonight without bolus.    Underwent s/p L PCN 12/30. Heparin was stopped - planned to restart on 12/30@1500 . Hgb 9.8, plt WNL. No s/sx of bleeding.   Goal of Therapy:  Heparin level 0.3-0.7 units/ml Monitor platelets by anticoagulation protocol: Yes   Plan:  Restart heparin infusion at 1200 units/hr on 12/30@1500  Check anti-Xa level in 8 hours and daily while on heparin Continue to monitor H&H and platelets  Antonietta Jewel, PharmD, BCCCP Clinical Pharmacist  Phone: [phone]  Please check AMION for all [place] phone numbers After 10:00 PM, call [place] [phone] 11/16/2019 1:19 PM

## 2019-11-16 NOTE — Progress Notes (Signed)
Dr. Zigmund Daniel notified patient's temp 101.4, will given tylenol po at this time.

## 2019-11-16 NOTE — Procedures (Signed)
Left obstructing UPJ stone and pyonephrosis  S/p left PCN No comp Stable ebl min Purulent urine asp cx sent Full report in pacs

## 2019-11-16 NOTE — Progress Notes (Signed)
Patient taken to IR via bed at 1130 hrs.

## 2019-11-16 NOTE — Sedation Documentation (Signed)
Placed on 02 2L [place] as Sp02 87-91. Spo2 increased to 95%

## 2019-11-16 NOTE — Progress Notes (Signed)
PROGRESS NOTE    Claudia Christian  TWS:[id number] DOB: 02-Feb-1946 DOA: 11/15/2019 PCP: Patient, No Pcp Per    Brief Narrative: 73 y.o. female with medical history significant of diabetes, hypertension, hyperlipidemia, nephrolithiasis status post stenting and lithotripsy.  Patient presented initially to the outside hospital secondary to significant weakness and fatigue associated with fever.  Symptoms started about 3 days ago.  Patient had her husband help her in normal was called for transfer to the emergency department for persistent significant weakness.  She reports having a fever up to 104F.  Outside hospital course (personally reviewed): Patient was admitted for concern for sepsis in the setting of unknown source.  Patient was tested for Covid 19 twice with negative results.  Patient also found to have elevated troponin while inpatient concerning for NSTEMI.  CTA chest was negative for PE and significant for CAD.  Patient also noted to have small bilateral pleural effusions with possible Eraxis of right lung base. Patient underwent CT urogram which was significant for moderate to severe left-sided hydro and ureteral nephrosis secondary to obstructing 1 x 0.6 cm stone at the proximal left ureter with additional nonobstructing stones seen in the lower pole of the left kidney measuring up to 1.7 cm. Patient on Ceftriaxone  Labs dated 11/14/19 significant for: CBC: WBC 11.8, hemoglobin 9.8, hematocrit 29.4, platelets 271 BMP: Sodium 126, potassium 3.2, chloride 99, CO2 18, BUN 15, creatinine 1.0, glucose 175 Troponin trend of 2.02 at 8:20 AM, troponin of 1.69 at 12:20 PM, troponin of 1.37 at p.m. Osmolality of 248, calcium of 7.9, LDL cholesterol of 39.8, HDL of 14, INR 1.2, procalcitonin of 0.41  Assessment & Plan:   Principal Problem:   Sepsis (HCC) Active Problems:   Hydronephrosis with renal and ureteral calculus obstruction   Hyponatremia   NSTEMI (non-ST elevated myocardial  infarction) (HCC)   Type 2 diabetes mellitus with hyperlipidemia (HCC)    #1 recurrent left ureteral stone-with mild hydronephrosis-patient transferred from [facility] to be seen by urology and interventional radiology for nephrostomy tube.  Patient has been on Rocephin since admission to [facility]. Had fever 104 at home Temp 100.5 overnight T-max. Patient with history of nephrolithiasis and stenting and lithotripsy. Mild leukocytosis 10.6 Covid negative.  #2 type 2 diabetes  CBG (last 3)  Recent Labs    11/15/19 2110 11/16/19 0748  GLUCAP 154* 134*     #3 essential hypertension blood pressure 147/77 monitor carefully.  Not on any medications at home.  #4 hyperlipidemia continue statin. #5 elevated troponin patient without any symptoms unlikely to be cardiac in etiology.  CT showed no evidence of pulmonary embolism.  Echo by report normal ejection fraction with apical lateral hypokinesis.  Cardiology planning noninvasive evaluation versus cath. Echo 11/16/2019 pending.  Troponin V 19.  Follow trending.   Estimated body mass index is 27.81 kg/m as calculated from the following:   Height as of this encounter: 5\' 4"  (1.626 m).   Weight as of this encounter: 73.5 kg.  DVT prophylaxis: Heparin Code Status: Full code Family Communication: None  disposition Plan pending clinical improvement  consultants:   Cardiology, interventional radiology, urology  Procedures: None Antimicrobials: Rocephin Subjective: She is resting in bed awake and alert anxious to have the procedure done today. Objective: Vitals:   11/16/19 0000 11/16/19 0431 11/16/19 0432 11/16/19 0748  BP:  131/81  (!) 147/77  Pulse:  79  89  Resp:  (!) 22  20  Temp: 98.3 F (36.8 C) 99.4 F (  37.4 C)  99.5 F (37.5 C)  TempSrc: Oral Oral  Oral  SpO2:  97%  99%  Weight:   73.5 kg   Height:        Intake/Output Summary (Last 24 hours) at 11/16/2019 1122 Last data filed at 11/16/2019  1100 Gross per 24 hour  Intake 500.96 ml  Output --  Net 500.96 ml   Filed Weights   11/15/19 1713 11/16/19 0432  Weight: 74.3 kg 73.5 kg    Examination:  General exam: Appears calm and comfortable  Respiratory system: Clear to auscultation. Respiratory effort normal. Cardiovascular system: S1 & S2 heard, RRR. No JVD, murmurs, rubs, gallops or clicks. No pedal edema. Gastrointestinal system: Abdomen is nondistended, soft and nontender. No organomegaly or masses felt. Normal bowel sounds heard. Central nervous system: Alert and oriented. No focal neurological deficits. Extremities: Symmetric 5 x 5 power. Skin: No rashes, lesions or ulcers Psychiatry: Judgement and insight appear normal. Mood & affect appropriate.     Data Reviewed: I have personally reviewed following labs and imaging studies  CBC: Recent Labs  Lab 11/15/19 1834 11/16/19 0403  WBC 11.6* 10.6*  HGB 9.6* 9.8*  HCT 29.7* 29.9*  MCV 89.7 89.5  PLT 329 462   Basic Metabolic Panel: Recent Labs  Lab 11/15/19 1834 11/16/19 0403  NA 135 138  K 4.1 4.3  CL 107 109  CO2 21* 22  GLUCOSE 148* 186*  BUN 18 15  CREATININE 1.05* 1.05*  CALCIUM 7.9* 8.1*   GFR: Estimated Creatinine Clearance: 48.3 mL/min (A) (by C-G formula based on SCr of 1.05 mg/dL (H)). Liver Function Tests: Recent Labs  Lab 11/15/19 1834  AST 41  ALT 39  ALKPHOS 70  BILITOT 0.3  PROT 5.7*  ALBUMIN 1.9*   No results for input(s): LIPASE, AMYLASE in the last 168 hours. No results for input(s): AMMONIA in the last 168 hours. Coagulation Profile: Recent Labs  Lab 11/15/19 1834  INR 1.1   Cardiac Enzymes: No results for input(s): CKTOTAL, CKMB, CKMBINDEX, TROPONINI in the last 168 hours. BNP (last 3 results) No results for input(s): PROBNP in the last 8760 hours. HbA1C: Recent Labs    11/15/19 1828  HGBA1C 7.3*   CBG: Recent Labs  Lab 11/15/19 2110 11/16/19 0748  GLUCAP 154* 134*   Lipid Profile: No results for  input(s): CHOL, HDL, LDLCALC, TRIG, CHOLHDL, LDLDIRECT in the last 72 hours. Thyroid Function Tests: No results for input(s): TSH, T4TOTAL, FREET4, T3FREE, THYROIDAB in the last 72 hours. Anemia Panel: No results for input(s): VITAMINB12, FOLATE, FERRITIN, TIBC, IRON, RETICCTPCT in the last 72 hours. Sepsis Labs: No results for input(s): PROCALCITON, LATICACIDVEN in the last 168 hours.  Recent Results (from the past 240 hour(s))  SARS CORONAVIRUS 2 (TAT 6-24 HRS) Nasopharyngeal Nasopharyngeal Swab     Status: None   Collection Time: 11/15/19  5:17 PM   Specimen: Nasopharyngeal Swab  Result Value Ref Range Status   SARS Coronavirus 2 NEGATIVE NEGATIVE Final    Comment: (NOTE) SARS-CoV-2 target nucleic acids are NOT DETECTED. The SARS-CoV-2 RNA is generally detectable in upper and lower respiratory specimens during the acute phase of infection. Negative results do not preclude SARS-CoV-2 infection, do not rule out co-infections with other pathogens, and should not be used as the sole basis for treatment or other patient management decisions. Negative results must be combined with clinical observations, patient history, and epidemiological information. The expected result is Negative. Fact Sheet for Patients: SugarRoll.be Fact Sheet for Healthcare  Providers: quierodirigir.com This test is not yet approved or cleared by the [place] and  has been authorized for detection and/or diagnosis of SARS-CoV-2 by FDA under an Emergency Use Authorization (EUA). This EUA will remain  in effect (meaning this test can be used) for the duration of the COVID-19 declaration under Section 56 4(b)(1) of the Act, 21 U.S.C. section 360bbb-3(b)(1), unless the authorization is terminated or revoked sooner. Performed at [facility], [address], [place], [place] [address]          Radiology Studies: No results  found.      Scheduled Meds: . aspirin EC  81 mg Oral Daily  . atorvastatin  20 mg Oral q1800  . insulin aspart  0-9 Units Subcutaneous TID WC   Continuous Infusions: . sodium chloride 100 mL/hr at 11/16/19 1100  . cefTRIAXone (ROCEPHIN)  IV Stopped (11/16/19 0827)  . heparin Stopped (11/16/19 0813)     LOS: 1 day     Alwyn Ren, MD [facility]  If 7PM-7AM, please contact night-coverage [email or web address] Password [email or web address] 11/16/2019, 11:22 AM

## 2019-11-16 NOTE — Progress Notes (Addendum)
Progress Note  Patient Name: Claudia Christian Date of Encounter: 11/16/2019  Primary Cardiologist: Garwin Brothers, MD   Subjective   Fever of 100.5 overnight. Patient feels worse today. No chest pain. She does feel SOB on exertion.   Inpatient Medications    Scheduled Meds: . aspirin EC  81 mg Oral Daily  . atorvastatin  20 mg Oral q1800  . insulin aspart  0-9 Units Subcutaneous TID WC   Continuous Infusions: . sodium chloride    . cefTRIAXone (ROCEPHIN)  IV    . heparin 1,200 Units/hr (11/16/19 2542)   PRN Meds: acetaminophen **OR** acetaminophen, ondansetron **OR** ondansetron (ZOFRAN) IV, polyethylene glycol   Vital Signs    Vitals:   11/15/19 2112 11/16/19 0000 11/16/19 0431 11/16/19 0432  BP: 132/73  131/81   Pulse: 96  79   Resp: 20  (!) 22   Temp: (!) 100.5 F (38.1 C) 98.3 F (36.8 C) 99.4 F (37.4 C)   TempSrc: Oral Oral Oral   SpO2: 94%  97%   Weight:    73.5 kg  Height:        Intake/Output Summary (Last 24 hours) at 11/16/2019 0730 Last data filed at 11/16/2019 0556 Gross per 24 hour  Intake 98.1 ml  Output --  Net 98.1 ml   Last 3 Weights 11/16/2019 11/15/2019  Weight (lbs) 162 lb 0.6 oz 163 lb 11.2 oz  Weight (kg) 73.5 kg 74.254 kg      Telemetry    NSR, HR 70-80s, sinus tachycardia up to 152 - Personally Reviewed  ECG    No new - Personally Reviewed  Physical Exam   GEN: No acute distress.   Neck: No JVD Cardiac: RRR, no murmurs, rubs, or gallops.  Respiratory: Clear to auscultation bilaterally. GI: Soft, nontender, non-distended  MS: No edema; No deformity. Neuro:  Nonfocal  Psych: Normal affect   Labs    High Sensitivity Troponin:  No results for input(s): TROPONINIHS in the last 720 hours.    Chemistry Recent Labs  Lab 11/15/19 1834 11/16/19 0403  NA 135 138  K 4.1 4.3  CL 107 109  CO2 21* 22  GLUCOSE 148* 186*  BUN 18 15  CREATININE 1.05* 1.05*  CALCIUM 7.9* 8.1*  PROT 5.7*  --   ALBUMIN 1.9*  --    AST 41  --   ALT 39  --   ALKPHOS 70  --   BILITOT 0.3  --   GFRNONAA 53* 53*  GFRAA >60 >60  ANIONGAP 7 7     Hematology Recent Labs  Lab 11/15/19 1834 11/16/19 0403  WBC 11.6* 10.6*  RBC 3.31* 3.34*  HGB 9.6* 9.8*  HCT 29.7* 29.9*  MCV 89.7 89.5  MCH 29.0 29.3  MCHC 32.3 32.8  RDW 13.2 13.2  PLT 329 311    BNPNo results for input(s): BNP, PROBNP in the last 168 hours.   DDimer No results for input(s): DDIMER in the last 168 hours.   Radiology    No results found.  Cardiac Studies   Echo from [facility] Normal global LV contractility Overall LV systolic function is normal, EF 55-60% Apical lateral LV wall motion is hypokinetic  Patient Profile     73 y.o. female with a hx of DM2, HTN, HL, kidney stones with prior interventions admitted to [facility] with weakness, fatigue and fever who is being seen for elevated troponin  Assessment & Plan    Sepsis - Unclear source, possibly renal  -  COVID negative - Blood cultures negative on arrival from [place] - IV abx per primary team  Hydronephrosis - Patient has h/o of obstructive nephrolithiasis requiring stent and lithotripsy - Concern for mild pyelo - Urology is planning nephrostomy tube  Elevated Troponin - elevated in the setting of sepsis - Troponin trend is down 2.02-->1.69-->1.37. Will check a troponin here - Patient denies chest pain or SOB - CT PE showed coronary calcifications - Echo showed normal LVEF with apical lateral hypokinesis - EKG without specific changes - Can consider cath vs noninvasive evaluations once medical issues have improved - Continue ASA, BB, hep drip - No ACE given urinary obstruction  DM2 - SSI per IM  For questions or updates, please contact [facility] Please consult [email or web address] for contact info under        Signed, Cadence Ninfa Meeker, PA-C  11/16/2019, 7:30 AM    Attending Note:   The patient was seen and examined.  Agree with assessment and plan  as noted above.  Changes made to the above note as needed.  Patient seen and independently examined with  Cadence Kathlen Mody, PA .   We discussed all aspects of the encounter. I agree with the assessment and plan as stated above.  1.  Troponin elevation: Patient does not have known coronary artery disease.  She has several risk factors for coronary artery disease including hypertension and diabetes mellitus.  She presents with sepsis syndrome secondary to UTI and kidney stones.  She is just now status post nephrostomy tube.  It is quite possible that the troponin elevation is due to demand ischemia from her sepsis syndrome.  I would want to wait and allow her urologic issues to be addressed before we had down the path for ischemic work-up.  She is currently pain-free.  She does have coronary artery calcifications on a CT scan.  We will continue to follow for now.  Continue current medications.  Heparin will be restarted several hours after her nephrostomy procedure.  ECG tomorrow    I have spent a total of 40 minutes with patient reviewing hospital  notes , telemetry, EKGs, labs and examining patient as well as establishing an assessment and plan that was discussed with the patient. > 50% of time was spent in direct patient care.    Thayer Headings, Brooke Bonito., MD, [facility] 11/16/2019, 1:10 PM [address]  [place] Pager [phone]

## 2019-11-16 NOTE — Progress Notes (Signed)
ANTICOAGULATION CONSULT NOTE - Follow Up Consult  Pharmacy Consult for heparin Indication: elevated troponin  Labs: Recent Labs    11/15/19 1834 11/16/19 0403  HGB 9.6* 9.8*  HCT 29.7* 29.9*  PLT 329 311  LABPROT 14.1  --   INR 1.1  --   HEPARINUNFRC  --  0.15*  CREATININE 1.05* 1.05*    Assessment: 73yo female subtherapeutic on heparin with initial dosing for elevated troponin; no gtt issues or signs of bleeding per RN.  Goal of Therapy:  Heparin level 0.3-0.7 units/ml   Plan:  Will give 2000 units IV bolus x1 and increase heparin gtt by 2-3 units/kg/hr to 1200 units/hr and check level in 8 hours.    [name], PharmD, BCPS  11/16/2019,5:41 AM

## 2019-11-16 NOTE — Progress Notes (Signed)
Patient's RR noted to be 23-30 on 2L  with sats 93-94%. Patient states DOE and denies cough.  Lungs clear upper lobes, diminished LL.  Dr. Rodena Piety notified of RR and oxygen needs.

## 2019-11-16 NOTE — Progress Notes (Signed)
Patient had echo at [place] on 11/13/2019, report in paper chart. Per Nahser cancel new echo order.

## 2019-11-17 LAB — CBC WITH DIFFERENTIAL/PLATELET
Abs Immature Granulocytes: 0.08 10*3/uL — ABNORMAL HIGH (ref 0.00–0.07)
Basophils Absolute: 0 10*3/uL (ref 0.0–0.1)
Basophils Relative: 0 %
Eosinophils Absolute: 0.2 10*3/uL (ref 0.0–0.5)
Eosinophils Relative: 1 %
HCT: 29.4 % — ABNORMAL LOW (ref 36.0–46.0)
Hemoglobin: 9.7 g/dL — ABNORMAL LOW (ref 12.0–15.0)
Immature Granulocytes: 1 %
Lymphocytes Relative: 12 %
Lymphs Abs: 1.5 10*3/uL (ref 0.7–4.0)
MCH: 28.9 pg (ref 26.0–34.0)
MCHC: 33 g/dL (ref 30.0–36.0)
MCV: 87.5 fL (ref 80.0–100.0)
Monocytes Absolute: 0.5 10*3/uL (ref 0.1–1.0)
Monocytes Relative: 4 %
Neutro Abs: 10 10*3/uL — ABNORMAL HIGH (ref 1.7–7.7)
Neutrophils Relative %: 82 %
Platelets: 340 10*3/uL (ref 150–400)
RBC: 3.36 MIL/uL — ABNORMAL LOW (ref 3.87–5.11)
RDW: 13.3 % (ref 11.5–15.5)
WBC: 12.3 10*3/uL — ABNORMAL HIGH (ref 4.0–10.5)
nRBC: 0 % (ref 0.0–0.2)

## 2019-11-17 LAB — COMPREHENSIVE METABOLIC PANEL
ALT: 46 U/L — ABNORMAL HIGH (ref 0–44)
AST: 40 U/L (ref 15–41)
Albumin: 1.8 g/dL — ABNORMAL LOW (ref 3.5–5.0)
Alkaline Phosphatase: 84 U/L (ref 38–126)
Anion gap: 8 (ref 5–15)
BUN: 11 mg/dL (ref 8–23)
CO2: 24 mmol/L (ref 22–32)
Calcium: 7.8 mg/dL — ABNORMAL LOW (ref 8.9–10.3)
Chloride: 104 mmol/L (ref 98–111)
Creatinine, Ser: 1.09 mg/dL — ABNORMAL HIGH (ref 0.44–1.00)
GFR calc Af Amer: 59 mL/min — ABNORMAL LOW (ref >60)
GFR calc non Af Amer: 51 mL/min — ABNORMAL LOW (ref >60)
Glucose, Bld: 148 mg/dL — ABNORMAL HIGH (ref 70–99)
Potassium: 3.8 mmol/L (ref 3.5–5.1)
Sodium: 136 mmol/L (ref 135–145)
Total Bilirubin: 0.6 mg/dL (ref 0.3–1.2)
Total Protein: 5.8 g/dL — ABNORMAL LOW (ref 6.5–8.1)

## 2019-11-17 LAB — URINE CULTURE
Culture: 30000 — AB
Special Requests: NORMAL

## 2019-11-17 LAB — HEPARIN LEVEL (UNFRACTIONATED)
Heparin Unfractionated: 0.12 IU/mL — ABNORMAL LOW (ref 0.30–0.70)
Heparin Unfractionated: 0.16 IU/mL — ABNORMAL LOW (ref 0.30–0.70)

## 2019-11-17 LAB — GLUCOSE, CAPILLARY
Glucose-Capillary: 123 mg/dL — ABNORMAL HIGH (ref 70–99)
Glucose-Capillary: 173 mg/dL — ABNORMAL HIGH (ref 70–99)
Glucose-Capillary: 120 mg/dL — ABNORMAL HIGH (ref 70–99)
Glucose-Capillary: 180 mg/dL — ABNORMAL HIGH (ref 70–99)

## 2019-11-17 LAB — TROPONIN I (HIGH SENSITIVITY): Troponin I (High Sensitivity): 261 ng/L (ref <18)

## 2019-11-17 MED ORDER — METOPROLOL TARTRATE 12.5 MG HALF TABLET
12.5000 mg | ORAL_TABLET | Freq: Two times a day (BID) | ORAL | Status: DC
  Administered 2019-11-17 – 2019-11-18 (×3): 12.5 mg via ORAL
  Filled 2019-11-17 (×3): qty 1

## 2019-11-17 MED ORDER — ROSUVASTATIN CALCIUM 5 MG PO TABS: 10.0000 mg | ORAL_TABLET | Freq: Every day | ORAL | Status: DC

## 2019-11-17 MED ORDER — SERTRALINE HCL 50 MG PO TABS
50.0000 mg | ORAL_TABLET | Freq: Every day | ORAL | Status: DC
  Administered 2019-11-17 – 2019-11-19 (×3): 50 mg via ORAL
  Filled 2019-11-17 (×3): qty 1

## 2019-11-17 NOTE — Progress Notes (Signed)
ANTICOAGULATION CONSULT NOTE   Pharmacy Consult for Heparin Indication: chest pain/ACS  No Known Allergies  Patient Measurements: Height: 5\' 4"  (162.6 cm) Weight: 158 lb 3.2 oz (71.8 kg) IBW/kg (Calculated) : 54.7 Heparin Dosing Weight: 70kg Vital Signs: Temp: 98 F (36.7 C) (12/31 0550) Temp Source: Oral (12/31 0550) BP: 118/86 (12/31 0903) Pulse Rate: 91 (12/31 0903)  Labs: Recent Labs    11/15/19 1834 11/16/19 0403 11/16/19 0825 11/16/19 1306 11/16/19 2137 11/17/19 0332 11/17/19 0753 11/17/19 0924  HGB 9.6* 9.8*  --   --   --  9.7*  --   --   HCT 29.7* 29.9*  --   --   --  29.4*  --   --   PLT 329 311  --   --   --  340  --   --   LABPROT 14.1  --   --   --   --   --   --   --   INR 1.1  --   --   --   --   --   --   --   HEPARINUNFRC  --  0.15*  --   --  <0.10*  --   --  0.12*  CREATININE 1.05* 1.05*  --   --   --  1.09*  --   --   TROPONINIHS  --   --  519* 382*  --   --  261*  --     Estimated Creatinine Clearance: 46 mL/min (A) (by C-G formula based on SCr of 1.09 mg/dL (H)).   Medical History: Past Medical History:  Diagnosis Date  . Diabetes mellitus type 2, controlled ([place])   . Essential hypertension   . Hyperlipidemia   . Nephrolithiasis     Assessment: 73 year old female transferred from [facility] hospital to [facility] for further evaluation including IR, cardiology, and urology.   Patient with elevated cardiac enzymes at [facility], she was receiving full dose lovenox, appears last dose was this morning. New orders received to transition to IV heparin, will start tonight without bolus.    Underwent s/p L PCN 12/30. Heparin was stopped - planned to restart on 12/30@1500 . Hgb 9.8, plt WNL. No s/sx of bleeding.   12/31 AM update:  Heparin level 0.12  Goal of Therapy:  Heparin level 0.3-0.7 units/ml Monitor platelets by anticoagulation protocol: Yes   Plan:  Inc heparin to 1700 units/hr Re-check heparin level in 6-8 hours unless d/c'd  Meg Niemeier A.  Levada Dy, PharmD, BCPS, FNKF Clinical Pharmacist [place] Please utilize Amion for appropriate phone number to reach the unit pharmacist ([place])

## 2019-11-17 NOTE — Progress Notes (Addendum)
Progress Note  Patient Name: Claudia Christian Date of Encounter: 11/17/2019  Primary Cardiologist: Garwin Brothers, MD   Subjective   Patient had PCN placed yesterday. Patient is feeling much better today. No chest pain or SOB. Fever of 101.4 overnight  Inpatient Medications    Scheduled Meds: . aspirin EC  81 mg Oral Daily  . atorvastatin  20 mg Oral q1800  . enalapril  20 mg Oral Daily  . insulin aspart  0-9 Units Subcutaneous TID WC  . sodium chloride flush  5 mL Intracatheter Q8H   Continuous Infusions: . cefTRIAXone (ROCEPHIN)  IV Stopped (11/16/19 0827)  . heparin 1,400 Units/hr (11/17/19 0045)   PRN Meds: acetaminophen **OR** acetaminophen, metoprolol tartrate, ondansetron **OR** ondansetron (ZOFRAN) IV, polyethylene glycol   Vital Signs    Vitals:   11/16/19 1734 11/16/19 1741 11/16/19 2110 11/17/19 0550  BP: 131/69  140/71 119/73  Pulse: 94 88 81 78  Resp: (!) 25 (!) 23 (!) 25 (!) 24  Temp: 98.9 F (37.2 C)  98.2 F (36.8 C) 98 F (36.7 C)  TempSrc: Oral  Oral Oral  SpO2: 93% 93% 97% 96%  Weight:    71.8 kg  Height:        Intake/Output Summary (Last 24 hours) at 11/17/2019 0720 Last data filed at 11/17/2019 5038 Gross per 24 hour  Intake 2149.58 ml  Output 2200 ml  Net -50.42 ml   Last 3 Weights 11/17/2019 11/16/2019 11/15/2019  Weight (lbs) 158 lb 3.2 oz 162 lb 0.6 oz 163 lb 11.2 oz  Weight (kg) 71.759 kg 73.5 kg 74.254 kg      Telemetry    NSR with PACs, HR 80s - Personally Reviewed  ECG    NSR, 87 bpm, PAC, nonspecific T wave changes, Qt 471 ms, poor R wave progression - Personally Reviewed  Physical Exam   GEN: No acute distress.   Neck: No JVD Cardiac: RRR, no murmurs, rubs, or gallops.  Respiratory: Clear to auscultation bilaterally. GI: Soft, nontender, non-distended  MS: No edema; No deformity. Neuro:  Nonfocal  Psych: Normal affect   Labs    High Sensitivity Troponin:   Recent Labs  Lab 11/16/19 0825 11/16/19 1306   TROPONINIHS 519* 382*      Chemistry Recent Labs  Lab 11/15/19 1834 11/16/19 0403 11/17/19 0332  NA 135 138 136  K 4.1 4.3 3.8  CL 107 109 104  CO2 21* 22 24  GLUCOSE 148* 186* 148*  BUN 18 15 11   CREATININE 1.05* 1.05* 1.09*  CALCIUM 7.9* 8.1* 7.8*  PROT 5.7*  --  5.8*  ALBUMIN 1.9*  --  1.8*  AST 41  --  40  ALT 39  --  46*  ALKPHOS 70  --  84  BILITOT 0.3  --  0.6  GFRNONAA 53* 53* 51*  GFRAA >60 >60 59*  ANIONGAP 7 7 8      Hematology Recent Labs  Lab 11/15/19 1834 11/16/19 0403 11/17/19 0332  WBC 11.6* 10.6* 12.3*  RBC 3.31* 3.34* 3.36*  HGB 9.6* 9.8* 9.7*  HCT 29.7* 29.9* 29.4*  MCV 89.7 89.5 87.5  MCH 29.0 29.3 28.9  MCHC 32.3 32.8 33.0  RDW 13.2 13.2 13.3  PLT 329 311 340    BNPNo results for input(s): BNP, PROBNP in the last 168 hours.   DDimer No results for input(s): DDIMER in the last 168 hours.   Radiology    IR NEPHROSTOMY PLACEMENT LEFT  Result Date: 11/16/2019 INDICATION: Obstructing  nephrolithiasis, hydronephrosis EXAM: ULTRASOUND FLUOROSCOPIC 10 FRENCH LEFT NEPHROSTOMY INSERTION COMPARISON:  11/14/2019 MEDICATIONS: Patient is already receiving IV antibiotics as an inpatient. ANESTHESIA/SEDATION: Fentanyl 50 mcg IV; Versed 1.0 mg IV Moderate Sedation Time:  11 minutes The patient was continuously monitored during the procedure by the interventional radiology nurse under my direct supervision. CONTRAST:  5 cc-administered into the collecting system(s) FLUOROSCOPY TIME:  Fluoroscopy Time: 0 minutes 6 seconds (5 mGy). COMPLICATIONS: None immediate. PROCEDURE: Informed written consent was obtained from the patient after a thorough discussion of the procedural risks, benefits and alternatives. All questions were addressed. Maximal Sterile Barrier Technique was utilized including caps, mask, sterile gowns, sterile gloves, sterile drape, hand hygiene and skin antiseptic. A timeout was performed prior to the initiation of the procedure. Previous imaging  reviewed. Patient position prone. Preliminary ultrasound performed. The hydronephrotic left kidney was localized and marked. Under sterile conditions and local anesthesia, percutaneous needle access performed of a mid to lower pole calyx. Needle position confirmed with ultrasound. Images obtained for documentation. There was return of purulent urine. Sample sent for culture. Guidewire inserted followed by the Accustick dilator set. Amplatz guidewire inserted into the renal pelvis. Tract dilatation performed to insert a 10 French nephrostomy. Nephrostomy catheter confirmed in the renal pelvis. Contrast injection confirms position. Syringe aspiration yielded purulent urine. Catheter secured with Prolene suture and connected to external gravity drainage bag. Sterile dressing applied. No immediate complication. Patient tolerated the procedure well. IMPRESSION: Successful ultrasound and fluoroscopic 10 French left nephrostomy insertion Electronically Signed   By: Judie Petit.  Shick M.D.   On: 11/16/2019 14:27    Cardiac Studies   Echo from [facility] Normal global LV contractility Overall LV systolic function is normal, EF 55-60% Apical lateral LV wall motion is hypokinetic  Patient Profile     73 y.o. female with a hx of DM2, HTN, HL, kidney stones with prior interventions admitted to [facility] with weakness, fatigue and fever who is being seen for elevated troponin.  Assessment & Plan    Sepsis - Renal source - COVID negative - Blood cultures negative on arrival from [place] - IV abx per primary team  Hydronephrosis - Patient has h/o of obstructive nephrolithiasis requiring stent and lithotripsy - Concern for mild pyelo - Nephrostomy tube placed, left side  Elevated Troponin - elevated in the setting of sepsis - Troponin trend is down 2.02-->1.69-->1.37.  - Patient denies chest pain or SOB - CT PE showed coronary calcifications - Echo showed normal LVEF with apical lateral hypokinesis -  EKG without specific changes - Can consider cath vs noninvasive evaluations once medical issues have improved - Continue ASA, BB. Can likely stop the heparin drip - No ACE given urinary obstruction  DM2 - SSI per IM   For questions or updates, please contact [facility] Please consult [email or web address] for contact info under        Signed, Cadence David Stall, PA-C  11/17/2019, 7:20 AM    Attending Note:   The patient was seen and examined.  Agree with assessment and plan as noted above.  Changes made to the above note as needed.  Patient seen and independently examined with  Roseanna Rainbow, PA .   We discussed all aspects of the encounter. I agree with the assessment and plan as stated above.  1.   NSTEMI :  Related to sepsis syndrome.   Echocardiogram at [facility] revealed anterolateral hypokinesis. At present she is still has an elevated temperature.  We will need to  consider further ischemic work-up once she is over the sepsis syndrome.  She might likely need a heart catheterization vs. Coronary CT angiogram .  We need to do our best to get the urology issues resolved before we do a stent procedure that commits her to plavix and ASA for a year.   We will continue to follow her for now and consider further work-up next week.   I have spent a total of 30 minutes with patient reviewing hospital  notes , telemetry, EKGs, labs and examining patient as well as establishing an assessment and plan that was discussed with the patient. > 50% of time was spent in direct patient care.    Thayer Headings, Brooke Bonito., MD, [facility] 11/17/2019, 4:28 PM [address],  [place] Pager [phone]

## 2019-11-17 NOTE — Progress Notes (Signed)
ANTICOAGULATION CONSULT NOTE   Pharmacy Consult for Heparin Indication: chest pain/ACS  No Known Allergies  Patient Measurements: Height: 5\' 4"  (162.6 cm) Weight: 158 lb 3.2 oz (71.8 kg) IBW/kg (Calculated) : 54.7 Heparin Dosing Weight: 70kg Vital Signs: Temp: 97.7 F (36.5 C) (12/31 1455) Temp Source: Oral (12/31 1455) BP: 138/70 (12/31 1455) Pulse Rate: 93 (12/31 1455)  Labs: Recent Labs    11/15/19 1834 11/15/19 1834 11/16/19 0403 11/16/19 0825 11/16/19 1306 11/16/19 2137 11/17/19 0332 11/17/19 0753 11/17/19 0924 11/17/19 1948  HGB 9.6*  --  9.8*  --   --   --  9.7*  --   --   --   HCT 29.7*  --  29.9*  --   --   --  29.4*  --   --   --   PLT 329  --  311  --   --   --  340  --   --   --   LABPROT 14.1  --   --   --   --   --   --   --   --   --   INR 1.1  --   --   --   --   --   --   --   --   --   HEPARINUNFRC  --    < > 0.15*  --   --  <0.10*  --   --  0.12* 0.16*  CREATININE 1.05*  --  1.05*  --   --   --  1.09*  --   --   --   TROPONINIHS  --   --   --  519* 382*  --   --  261*  --   --    < > = values in this interval not displayed.    Estimated Creatinine Clearance: 46 mL/min (A) (by C-G formula based on SCr of 1.09 mg/dL (H)).   Medical History: Past Medical History:  Diagnosis Date  . Diabetes mellitus type 2, controlled ([place])   . Essential hypertension   . Hyperlipidemia   . Nephrolithiasis     Assessment: 73 year old female transferred from [facility] hospital to [facility] for further evaluation including IR, cardiology, and urology.  Patient with elevated cardiac enzymes at [facility], she was receiving full dose lovenox then transitioned to IV Heparin - stopped 12/30 for L-PCN and resumed post-op.   Heparin level this evening remains SUBtherapeutic despite a rate increase earlier today (HL 0.16 << 0.12, goal of 0.3-0.7). No bleeding or issues noted per RN - nephrostomy tube with serous drainage but not blood-tinged.   Goal of Therapy:  Heparin level  0.3-0.7 units/ml Monitor platelets by anticoagulation protocol: Yes   Plan:  - Increase Heparin to 1950 units/hr (19.5 ml/hr)  - Will continue to monitor for any signs/symptoms of bleeding and will follow up with heparin level in 8 hours   Thank you for allowing pharmacy to be a part of this patient's care.  Alycia Rossetti, PharmD, BCPS Clinical Pharmacist Clinical phone for 11/17/2019: [phone] 11/17/2019 8:25 PM   **Pharmacist phone directory can now be found on [email or web address] ([email or web address]).  Listed under [place].

## 2019-11-17 NOTE — Progress Notes (Signed)
ANTICOAGULATION CONSULT NOTE   Pharmacy Consult for Heparin Indication: chest pain/ACS  No Known Allergies  Patient Measurements: Height: 5\' 4"  (162.6 cm) Weight: 162 lb 0.6 oz (73.5 kg) IBW/kg (Calculated) : 54.7 Heparin Dosing Weight: 70kg Vital Signs: Temp: 98.2 F (36.8 C) (12/30 2110) Temp Source: Oral (12/30 2110) BP: 140/71 (12/30 2110) Pulse Rate: 81 (12/30 2110)  Labs: Recent Labs    11/15/19 1834 11/16/19 0403 11/16/19 0825 11/16/19 1306 11/16/19 2137  HGB 9.6* 9.8*  --   --   --   HCT 29.7* 29.9*  --   --   --   PLT 329 311  --   --   --   LABPROT 14.1  --   --   --   --   INR 1.1  --   --   --   --   HEPARINUNFRC  --  0.15*  --   --  <0.10*  CREATININE 1.05* 1.05*  --   --   --   TROPONINIHS  --   --  519* 382*  --     Estimated Creatinine Clearance: 48.3 mL/min (A) (by C-G formula based on SCr of 1.05 mg/dL (H)).   Medical History: Past Medical History:  Diagnosis Date  . Diabetes mellitus type 2, controlled ([place])   . Essential hypertension   . Hyperlipidemia   . Nephrolithiasis     Assessment: 72 year old female transferred from [facility] hospital to [facility] for further evaluation including IR, cardiology, and urology.   Patient with elevated cardiac enzymes at [facility], she was receiving full dose lovenox, appears last dose was this morning. New orders received to transition to IV heparin, will start tonight without bolus.    Underwent s/p L PCN 12/30. Heparin was stopped - planned to restart on 12/30@1500 . Hgb 9.8, plt WNL. No s/sx of bleeding.   12/31 AM update:  Heparin level undetectable after re-start  Goal of Therapy:  Heparin level 0.3-0.7 units/ml Monitor platelets by anticoagulation protocol: Yes   Plan:  Inc heparin to 1400 units/hr Re-check heparin level in 6-8 hours  Narda Bonds, PharmD, [place] Pharmacist Phone: [phone]

## 2019-11-17 NOTE — Progress Notes (Signed)
PROGRESS NOTE    Claudia Christian  ZCH:[id number] DOB: 08/15/1946 DOA: 11/15/2019 PCP: Patient, No Pcp Per  Brief Narrative:73 y.o.femalewith medical history significant ofdiabetes, hypertension, hyperlipidemia, nephrolithiasis status post stenting and lithotripsy.Patient presented initially to the outside hospital secondary to significant weakness and fatigue associated with fever. Symptoms started about 3 days ago. Patient had her husband help her in normal was called for transfer to the emergency department for persistent significant weakness. She reports having a fever up to 104F.  Outside hospital course(personally reviewed): Patient was admitted for concern for sepsis in the setting of unknown source. Patient was tested for Covid 19 twice with negative results. Patient also found to have elevated troponin while inpatient concerning for NSTEMI. CTA chest was negative for PE and significant for CAD. Patient also noted to have small bilateral pleural effusions with possible Eraxis of right lung base. Patient underwent CT urogram which was significant for moderate to severe left-sided hydro and ureteral nephrosis secondary to obstructing 1 x 0.6 cm stone at the proximal left ureter with additional nonobstructing stones seen in the lower pole of the left kidney measuring up to 1.7 cm.Patient on Ceftriaxone  Labs dated 11/14/19 significant for: CBC: WBC 11.8, hemoglobin 9.8, hematocrit 29.4, platelets 271 BMP: Sodium 126, potassium 3.2, chloride 99, CO2 18, BUN 15, creatinine 1.0, glucose 175 Troponin trend of 2.02 at 8:20 AM, troponin of 1.69 at 12:20 PM, troponin of 1.37 at p.m. Osmolality of 248, calcium of 7.9, LDL cholesterol of 39.8, HDL of 14, INR 1.2, procalcitonin of 0.41  Assessment & Plan:   Principal Problem:   Sepsis (HCC) Active Problems:   Hydronephrosis with renal and ureteral calculus obstruction   Hyponatremia   NSTEMI (non-ST elevated myocardial infarction)  (HCC)   Type 2 diabetes mellitus with hyperlipidemia (HCC)   Nephrolithiasis   #1 recurrent left ureteral stone-with mild hydronephrosis-patient transferred from [facility] to be seen by urology and interventional radiology for nephrostomy tube.  Patient has been on Rocephin since admission to [facility]. Had fever 104 at home,TMAX 101.4 Temp 100.5 overnight T-max. Patient with history of nephrolithiasis and stenting and lithotripsy. Mild leukocytosis 10.6 Covid negative. CALLED [facility]- BLOOD CULTURE - 12/27 NEGATIVE  Urine culture with mixed growth. Repeat urine culture 11/16/2019 here at [facility] pending. Creatinine 1.09 White count slightly up 12.3 Follow-up labs in a.m.  #2 type 2 diabetes -134, 124, 268, 162, 123. CBG (last 3)  Recent Labs (last 2 labs)       Recent Labs    11/15/19 2110 11/16/19 0748  GLUCAP 154* 134*     She takes Glucophage 1000 mg twice a day with Januvia at home which has not been restarted.  Continue SSI.  #3 essential hypertension blood pressure 118/86.  She was on an ACE inhibitor at home which was restarted 11/16/2019.  On metoprolol and Vasotec.  #4 hyperlipidemia continue statin.  Crestor.  #5 elevated troponin patient without any symptoms unlikely to be cardiac in etiology.  CT showed no evidence of pulmonary embolism.  Echo by report normal ejection fraction with apical lateral hypokinesis.   Cardiology planning likely noninvasive evaluation  Still on heparin drip will defer to cardiology.  #6 depression continue Zoloft.      Estimated body mass index is 27.15 kg/m as calculated from the following:   Height as of this encounter: 5\' 4"  (1.626 m).   Weight as of this encounter: 71.8 kg.  DVT prophylaxis: Heparin Code Status: Full code Family Communication: None  disposition Plan pending clinical improvement  consultants:   Cardiology, interventional radiology, urology  Procedures:  None Antimicrobials: Rocephin Subjective: Patient is resting in bed feels better today.  Patient had fever of 101.4 last evening.  This was associated with tachypnea and tachycardia.  IV fluids were stopped.  She was given a dose of Lasix 20 mg x 1.  Her symptoms improved.  Objective: Vitals:   11/16/19 1741 11/16/19 2110 11/17/19 0550 11/17/19 0903  BP:  140/71 119/73 118/86  Pulse: 88 81 78 91  Resp: (!) 23 (!) 25 (!) 24   Temp:  98.2 F (36.8 C) 98 F (36.7 C)   TempSrc:  Oral Oral   SpO2: 93% 97% 96%   Weight:   71.8 kg   Height:        Intake/Output Summary (Last 24 hours) at 11/17/2019 1151 Last data filed at 11/17/2019 6629 Gross per 24 hour  Intake 1746.72 ml  Output 2200 ml  Net -453.28 ml   Filed Weights   11/15/19 1713 11/16/19 0432 11/17/19 0550  Weight: 74.3 kg 73.5 kg 71.8 kg    Examination:  General exam: Appears calm and comfortable  Respiratory system: Clear to auscultation. Respiratory effort normal. Cardiovascular system: S1 & S2 heard, RRR. No JVD, murmurs, rubs, gallops or clicks. No pedal edema. Gastrointestinal system: Abdomen is nondistended, soft and nontender. No organomegaly or masses felt. Normal bowel sounds heard.  Left nephrostomy tube in place draining clear urine. Central nervous system: Alert and oriented. No focal neurological deficits. Extremities: Symmetric 5 x 5 power. Skin: No rashes, lesions or ulcers Psychiatry: Judgement and insight appear normal. Mood & affect appropriate.     Data Reviewed: I have personally reviewed following labs and imaging studies  CBC: Recent Labs  Lab 11/15/19 1834 11/16/19 0403 11/17/19 0332  WBC 11.6* 10.6* 12.3*  NEUTROABS  --   --  10.0*  HGB 9.6* 9.8* 9.7*  HCT 29.7* 29.9* 29.4*  MCV 89.7 89.5 87.5  PLT 329 311 476   Basic Metabolic Panel: Recent Labs  Lab 11/15/19 1834 11/16/19 0403 11/17/19 0332  NA 135 138 136  K 4.1 4.3 3.8  CL 107 109 104  CO2 21* 22 24  GLUCOSE 148* 186*  148*  BUN 18 15 11   CREATININE 1.05* 1.05* 1.09*  CALCIUM 7.9* 8.1* 7.8*   GFR: Estimated Creatinine Clearance: 46 mL/min (A) (by C-G formula based on SCr of 1.09 mg/dL (H)). Liver Function Tests: Recent Labs  Lab 11/15/19 1834 11/17/19 0332  AST 41 40  ALT 39 46*  ALKPHOS 70 84  BILITOT 0.3 0.6  PROT 5.7* 5.8*  ALBUMIN 1.9* 1.8*   No results for input(s): LIPASE, AMYLASE in the last 168 hours. No results for input(s): AMMONIA in the last 168 hours. Coagulation Profile: Recent Labs  Lab 11/15/19 1834  INR 1.1   Cardiac Enzymes: No results for input(s): CKTOTAL, CKMB, CKMBINDEX, TROPONINI in the last 168 hours. BNP (last 3 results) No results for input(s): PROBNP in the last 8760 hours. HbA1C: Recent Labs    11/15/19 1828  HGBA1C 7.3*   CBG: Recent Labs  Lab 11/16/19 0748 11/16/19 1314 11/16/19 1755 11/16/19 2110 11/17/19 0729  GLUCAP 134* 124* 268* 162* 123*   Lipid Profile: No results for input(s): CHOL, HDL, LDLCALC, TRIG, CHOLHDL, LDLDIRECT in the last 72 hours. Thyroid Function Tests: No results for input(s): TSH, T4TOTAL, FREET4, T3FREE, THYROIDAB in the last 72 hours. Anemia Panel: No results for input(s): VITAMINB12, FOLATE, FERRITIN,  TIBC, IRON, RETICCTPCT in the last 72 hours. Sepsis Labs: No results for input(s): PROCALCITON, LATICACIDVEN in the last 168 hours.  Recent Results (from the past 240 hour(s))  SARS CORONAVIRUS 2 (TAT 6-24 HRS) Nasopharyngeal Nasopharyngeal Swab     Status: None   Collection Time: 11/15/19  5:17 PM   Specimen: Nasopharyngeal Swab  Result Value Ref Range Status   SARS Coronavirus 2 NEGATIVE NEGATIVE Final    Comment: (NOTE) SARS-CoV-2 target nucleic acids are NOT DETECTED. The SARS-CoV-2 RNA is generally detectable in upper and lower respiratory specimens during the acute phase of infection. Negative results do not preclude SARS-CoV-2 infection, do not rule out co-infections with other pathogens, and should not be  used as the sole basis for treatment or other patient management decisions. Negative results must be combined with clinical observations, patient history, and epidemiological information. The expected result is Negative. Fact Sheet for Patients: HairSlick.nohttps://www.fda.gov/media/138098/download Fact Sheet for Healthcare Providers: quierodirigir.comhttps://www.fda.gov/media/138095/download This test is not yet approved or cleared by the [place] FDA and  has been authorized for detection and/or diagnosis of SARS-CoV-2 by FDA under an Emergency Use Authorization (EUA). This EUA will remain  in effect (meaning this test can be used) for the duration of the COVID-19 declaration under Section 56 4(b)(1) of the Act, 21 U.S.C. section 360bbb-3(b)(1), unless the authorization is terminated or revoked sooner. Performed at [facility], [address] [place], [place] [address]   Urine culture     Status: None (Preliminary result)   Collection Time: 11/16/19 12:42 PM   Specimen: Kidney; Urine  Result Value Ref Range Status   Specimen Description KIDNEY LEFT  Final   Special Requests Normal  Final   Culture   Final    CULTURE REINCUBATED FOR BETTER GROWTH Performed at [facility], [address] [place], [place] [address]    Report Status PENDING  Incomplete         Radiology Studies: IR NEPHROSTOMY PLACEMENT LEFT  Result Date: 11/16/2019 INDICATION: Obstructing nephrolithiasis, hydronephrosis EXAM: ULTRASOUND FLUOROSCOPIC 10 FRENCH LEFT NEPHROSTOMY INSERTION COMPARISON:  11/14/2019 MEDICATIONS: Patient is already receiving IV antibiotics as an inpatient. ANESTHESIA/SEDATION: Fentanyl 50 mcg IV; Versed 1.0 mg IV Moderate Sedation Time:  11 minutes The patient was continuously monitored during the procedure by the interventional radiology nurse under my direct supervision. CONTRAST:  5 cc-administered into the collecting system(s) FLUOROSCOPY TIME:  Fluoroscopy Time: 0 minutes 6 seconds (5  mGy). COMPLICATIONS: None immediate. PROCEDURE: Informed written consent was obtained from the patient after a thorough discussion of the procedural risks, benefits and alternatives. All questions were addressed. Maximal Sterile Barrier Technique was utilized including caps, mask, sterile gowns, sterile gloves, sterile drape, hand hygiene and skin antiseptic. A timeout was performed prior to the initiation of the procedure. Previous imaging reviewed. Patient position prone. Preliminary ultrasound performed. The hydronephrotic left kidney was localized and marked. Under sterile conditions and local anesthesia, percutaneous needle access performed of a mid to lower pole calyx. Needle position confirmed with ultrasound. Images obtained for documentation. There was return of purulent urine. Sample sent for culture. Guidewire inserted followed by the Accustick dilator set. Amplatz guidewire inserted into the renal pelvis. Tract dilatation performed to insert a 10 French nephrostomy. Nephrostomy catheter confirmed in the renal pelvis. Contrast injection confirms position. Syringe aspiration yielded purulent urine. Catheter secured with Prolene suture and connected to external gravity drainage bag. Sterile dressing applied. No immediate complication. Patient tolerated the procedure well. IMPRESSION: Successful ultrasound and  fluoroscopic 10 French left nephrostomy insertion Electronically Signed   By: Judie Petit.  Shick M.D.   On: 11/16/2019 14:27        Scheduled Meds: . aspirin EC  81 mg Oral Daily  . atorvastatin  20 mg Oral q1800  . enalapril  20 mg Oral Daily  . insulin aspart  0-9 Units Subcutaneous TID WC  . metoprolol tartrate  12.5 mg Oral BID  . sodium chloride flush  5 mL Intracatheter Q8H   Continuous Infusions: . cefTRIAXone (ROCEPHIN)  IV 1 g (11/17/19 0904)  . heparin 1,700 Units/hr (11/17/19 1109)     LOS: 2 days     Alwyn Ren, MD [facility]  If 7PM-7AM, please contact  night-coverage [email or web address] Password [email or web address] 11/17/2019, 11:51 AM

## 2019-11-17 NOTE — Progress Notes (Signed)
Referring Physician(s): Manny,T  Supervising Physician: Simonne Come  Patient Status:  [facility] - In-pt  Chief Complaint:  Left kidney stones/hydronephrosis  Subjective: Pt doing well; denies sig flank pain,N/V or resp issues   Allergies: Patient has no known allergies.  Medications: Prior to Admission medications   Medication Sig Start Date End Date Taking? Authorizing Provider  alendronate (FOSAMAX) 70 MG tablet Take 70 mg by mouth once a week. Take with a full glass of water on an empty stomach.   Yes [provider]  enalapril (VASOTEC) 20 MG tablet Take 20 mg by mouth daily.   Yes [provider]  metFORMIN (GLUCOPHAGE) 1000 MG tablet Take 1,000 mg by mouth 2 (two) times daily with a meal.   Yes [provider]  rosuvastatin (CRESTOR) 10 MG tablet Take 10 mg by mouth daily.   Yes [provider]  sertraline (ZOLOFT) 50 MG tablet Take 50 mg by mouth daily.   Yes [provider]  sitaGLIPtin (JANUVIA) 25 MG tablet Take 25 mg by mouth daily.   Yes [provider]     Vital Signs: BP 118/86   Pulse 91   Temp 98 F (36.7 C) (Oral)   Resp (!) 24   Ht 5\' 4"  (1.626 m)   Wt 158 lb 3.2 oz (71.8 kg)   SpO2 96%   BMI 27.15 kg/m   Physical Exam awake/alert; left PCN intact, site NT, OP 1.2 liters clear urine, drain flushed without difficulty  Imaging: IR NEPHROSTOMY PLACEMENT LEFT  Result Date: 11/16/2019 INDICATION: Obstructing nephrolithiasis, hydronephrosis EXAM: ULTRASOUND FLUOROSCOPIC 10 FRENCH LEFT NEPHROSTOMY INSERTION COMPARISON:  11/14/2019 MEDICATIONS: Patient is already receiving IV antibiotics as an inpatient. ANESTHESIA/SEDATION: Fentanyl 50 mcg IV; Versed 1.0 mg IV Moderate Sedation Time:  11 minutes The patient was continuously monitored during the procedure by the interventional radiology nurse under my direct supervision. CONTRAST:  5 cc-administered into the collecting system(s) FLUOROSCOPY TIME:   Fluoroscopy Time: 0 minutes 6 seconds (5 mGy). COMPLICATIONS: None immediate. PROCEDURE: Informed written consent was obtained from the patient after a thorough discussion of the procedural risks, benefits and alternatives. All questions were addressed. Maximal Sterile Barrier Technique was utilized including caps, mask, sterile gowns, sterile gloves, sterile drape, hand hygiene and skin antiseptic. A timeout was performed prior to the initiation of the procedure. Previous imaging reviewed. Patient position prone. Preliminary ultrasound performed. The hydronephrotic left kidney was localized and marked. Under sterile conditions and local anesthesia, percutaneous needle access performed of a mid to lower pole calyx. Needle position confirmed with ultrasound. Images obtained for documentation. There was return of purulent urine. Sample sent for culture. Guidewire inserted followed by the Accustick dilator set. Amplatz guidewire inserted into the renal pelvis. Tract dilatation performed to insert a 10 French nephrostomy. Nephrostomy catheter confirmed in the renal pelvis. Contrast injection confirms position. Syringe aspiration yielded purulent urine. Catheter secured with Prolene suture and connected to external gravity drainage bag. Sterile dressing applied. No immediate complication. Patient tolerated the procedure well. IMPRESSION: Successful ultrasound and fluoroscopic 10 French left nephrostomy insertion Electronically Signed   By: 11/16/2019.  Shick M.D.   On: 11/16/2019 14:27    Labs:  CBC: Recent Labs    11/15/19 1834 11/16/19 0403 11/17/19 0332  WBC 11.6* 10.6* 12.3*  HGB 9.6* 9.8* 9.7*  HCT 29.7* 29.9* 29.4*  PLT 329 311 340    COAGS: Recent Labs    11/15/19 1834  INR 1.1    BMP: Recent Labs  11/15/19 1834 11/16/19 0403 11/17/19 0332  NA 135 138 136  K 4.1 4.3 3.8  CL 107 109 104  CO2 21* 22 24  GLUCOSE 148* 186* 148*  BUN 18 15 11   CALCIUM 7.9* 8.1* 7.8*  CREATININE 1.05* 1.05*  1.09*  GFRNONAA 53* 53* 51*  GFRAA >60 >60 59*    LIVER FUNCTION TESTS: Recent Labs    11/15/19 1834 11/17/19 0332  BILITOT 0.3 0.6  AST 41 40  ALT 39 46*  ALKPHOS 70 84  PROT 5.7* 5.8*  ALBUMIN 1.9* 1.8*    Assessment and Plan: Pt with hx obstructing nephrolithiasis/left hydronephrosis; s/p left PCN 12/30; afebrile; WBC 12.3(10.6), hgb 9.7, creat 1.09(1.05), urine cx pend; cont drain , monitor labs/cx data; further plans as per urology/TRH   Electronically Signed: D. Rowe Robert, PA-C 11/17/2019, 12:08 PM   I spent a total of 15 minutes at the the patient's bedside AND on the patient's hospital floor or unit, greater than 50% of which was counseling/coordinating care for left nephrostomy    Patient ID: Claudia Christian, female   DOB: July 16, 1946, 73 y.o.   MRN: [id number]

## 2019-11-18 DIAGNOSIS — I219 Acute myocardial infarction, unspecified: Secondary | ICD-10-CM

## 2019-11-18 DIAGNOSIS — N200 Calculus of kidney: Secondary | ICD-10-CM

## 2019-11-18 HISTORY — DX: Acute myocardial infarction, unspecified: I21.9

## 2019-11-18 LAB — CBC
HCT: 27.7 % — ABNORMAL LOW (ref 36.0–46.0)
Hemoglobin: 9 g/dL — ABNORMAL LOW (ref 12.0–15.0)
MCH: 28.7 pg (ref 26.0–34.0)
MCHC: 32.5 g/dL (ref 30.0–36.0)
MCV: 88.2 fL (ref 80.0–100.0)
Platelets: 366 10*3/uL (ref 150–400)
RBC: 3.14 MIL/uL — ABNORMAL LOW (ref 3.87–5.11)
RDW: 13.4 % (ref 11.5–15.5)
WBC: 10.6 10*3/uL — ABNORMAL HIGH (ref 4.0–10.5)
nRBC: 0 % (ref 0.0–0.2)

## 2019-11-18 LAB — BASIC METABOLIC PANEL
Anion gap: 8 (ref 5–15)
BUN: 14 mg/dL (ref 8–23)
CO2: 25 mmol/L (ref 22–32)
Calcium: 8.3 mg/dL — ABNORMAL LOW (ref 8.9–10.3)
Chloride: 105 mmol/L (ref 98–111)
Creatinine, Ser: 0.93 mg/dL (ref 0.44–1.00)
GFR calc Af Amer: >60 mL/min (ref >60)
GFR calc non Af Amer: >60 mL/min (ref >60)
Glucose, Bld: 151 mg/dL — ABNORMAL HIGH (ref 70–99)
Potassium: 3.4 mmol/L — ABNORMAL LOW (ref 3.5–5.1)
Sodium: 138 mmol/L (ref 135–145)

## 2019-11-18 LAB — GLUCOSE, CAPILLARY
Glucose-Capillary: 113 mg/dL — ABNORMAL HIGH (ref 70–99)
Glucose-Capillary: 217 mg/dL — ABNORMAL HIGH (ref 70–99)
Glucose-Capillary: 143 mg/dL — ABNORMAL HIGH (ref 70–99)
Glucose-Capillary: 163 mg/dL — ABNORMAL HIGH (ref 70–99)

## 2019-11-18 LAB — HEPARIN LEVEL (UNFRACTIONATED): Heparin Unfractionated: 0.25 IU/mL — ABNORMAL LOW (ref 0.30–0.70)

## 2019-11-18 MED ORDER — METOPROLOL TARTRATE 25 MG PO TABS
25.0000 mg | ORAL_TABLET | Freq: Two times a day (BID) | ORAL | Status: DC
  Administered 2019-11-18 – 2019-11-19 (×2): 25 mg via ORAL
  Filled 2019-11-18 (×2): qty 1

## 2019-11-18 NOTE — Progress Notes (Signed)
Progress Note  Patient Name: Claudia Christian Date of Encounter: 11/18/2019  Primary Cardiologist: Jenean Lindau, MD   Subjective   Patient had PCN placed is draining well. She denies any chest pain or SOB. No fevers in the last 24 hours.  Inpatient Medications    Scheduled Meds: . aspirin EC  81 mg Oral Daily  . enalapril  20 mg Oral Daily  . insulin aspart  0-9 Units Subcutaneous TID WC  . metoprolol tartrate  12.5 mg Oral BID  . sertraline  50 mg Oral Daily  . sodium chloride flush  5 mL Intracatheter Q8H   Continuous Infusions: . cefTRIAXone (ROCEPHIN)  IV 1 g (11/17/19 0904)  . heparin 2,100 Units/hr (11/18/19 0553)   PRN Meds: acetaminophen **OR** acetaminophen, metoprolol tartrate, ondansetron **OR** ondansetron (ZOFRAN) IV, polyethylene glycol   Vital Signs    Vitals:   11/17/19 1455 11/17/19 2209 11/17/19 2352 11/18/19 0547  BP: 138/70 130/71 140/75   Pulse: 93 77 87 70  Resp: 19     Temp: 97.7 F (36.5 C) 98.7 F (37.1 C)  98.6 F (37 C)  TempSrc: Oral Oral  Oral  SpO2: 94% 92%  94%  Weight:      Height:        Intake/Output Summary (Last 24 hours) at 11/18/2019 0940 Last data filed at 11/18/2019 0700 Gross per 24 hour  Intake 260 ml  Output 2025 ml  Net -1765 ml   Last 3 Weights 11/17/2019 11/16/2019 11/15/2019  Weight (lbs) 158 lb 3.2 oz 162 lb 0.6 oz 163 lb 11.2 oz  Weight (kg) 71.759 kg 73.5 kg 74.254 kg      Telemetry    NSR with PACs, HR 80s - Personally Reviewed  ECG    NSR, 87 bpm, PAC, nonspecific T wave changes, Qt 471 ms, poor R wave progression - Personally Reviewed  Physical Exam   GEN: No acute distress.   Neck: No JVD Cardiac: RRR, no murmurs, rubs, or gallops.  Respiratory: Clear to auscultation bilaterally. GI: Soft, nontender, non-distended  MS: No edema; No deformity. Neuro:  Nonfocal  Psych: Normal affect   Labs    High Sensitivity Troponin:   Recent Labs  Lab 11/16/19 0825 11/16/19 1306 11/17/19 0753    TROPONINIHS 519* 382* 261*      Chemistry Recent Labs  Lab 11/15/19 1834 11/16/19 0403 11/17/19 0332 11/18/19 0430  NA 135 138 136 138  K 4.1 4.3 3.8 3.4*  CL 107 109 104 105  CO2 21* 22 24 25   GLUCOSE 148* 186* 148* 151*  BUN 18 15 11 14   CREATININE 1.05* 1.05* 1.09* 0.93  CALCIUM 7.9* 8.1* 7.8* 8.3*  PROT 5.7*  --  5.8*  --   ALBUMIN 1.9*  --  1.8*  --   AST 41  --  40  --   ALT 39  --  46*  --   ALKPHOS 70  --  84  --   BILITOT 0.3  --  0.6  --   GFRNONAA 53* 53* 51* >60  GFRAA >60 >60 59* >60  ANIONGAP 7 7 8 8      Hematology Recent Labs  Lab 11/16/19 0403 11/17/19 0332 11/18/19 0430  WBC 10.6* 12.3* 10.6*  RBC 3.34* 3.36* 3.14*  HGB 9.8* 9.7* 9.0*  HCT 29.9* 29.4* 27.7*  MCV 89.5 87.5 88.2  MCH 29.3 28.9 28.7  MCHC 32.8 33.0 32.5  RDW 13.2 13.3 13.4  PLT 311 340 366  BNPNo results for input(s): BNP, PROBNP in the last 168 hours.   DDimer No results for input(s): DDIMER in the last 168 hours.   Radiology    IR NEPHROSTOMY PLACEMENT LEFT  Result Date: 11/16/2019 INDICATION: Obstructing nephrolithiasis, hydronephrosis EXAM: ULTRASOUND FLUOROSCOPIC 10 FRENCH LEFT NEPHROSTOMY INSERTION COMPARISON:  11/14/2019 MEDICATIONS: Patient is already receiving IV antibiotics as an inpatient. ANESTHESIA/SEDATION: Fentanyl 50 mcg IV; Versed 1.0 mg IV Moderate Sedation Time:  11 minutes The patient was continuously monitored during the procedure by the interventional radiology nurse under my direct supervision. CONTRAST:  5 cc-administered into the collecting system(s) FLUOROSCOPY TIME:  Fluoroscopy Time: 0 minutes 6 seconds (5 mGy). COMPLICATIONS: None immediate. PROCEDURE: Informed written consent was obtained from the patient after a thorough discussion of the procedural risks, benefits and alternatives. All questions were addressed. Maximal Sterile Barrier Technique was utilized including caps, mask, sterile gowns, sterile gloves, sterile drape, hand hygiene and skin  antiseptic. A timeout was performed prior to the initiation of the procedure. Previous imaging reviewed. Patient position prone. Preliminary ultrasound performed. The hydronephrotic left kidney was localized and marked. Under sterile conditions and local anesthesia, percutaneous needle access performed of a mid to lower pole calyx. Needle position confirmed with ultrasound. Images obtained for documentation. There was return of purulent urine. Sample sent for culture. Guidewire inserted followed by the Accustick dilator set. Amplatz guidewire inserted into the renal pelvis. Tract dilatation performed to insert a 10 French nephrostomy. Nephrostomy catheter confirmed in the renal pelvis. Contrast injection confirms position. Syringe aspiration yielded purulent urine. Catheter secured with Prolene suture and connected to external gravity drainage bag. Sterile dressing applied. No immediate complication. Patient tolerated the procedure well. IMPRESSION: Successful ultrasound and fluoroscopic 10 French left nephrostomy insertion Electronically Signed   By: Judie Petit.  Shick M.D.   On: 11/16/2019 14:27    Cardiac Studies   Echo from [facility] Normal global LV contractility Overall LV systolic function is normal, EF 55-60% Apical lateral LV wall motion is hypokinetic  Patient Profile     74 y.o. female with a hx of DM2, HTN, HL, kidney stones with prior interventions admitted to [facility] with weakness, fatigue and fever who is being seen for elevated troponin.  Assessment & Plan    Sepsis - Renal source - COVID negative - Blood cultures negative on arrival from [place] - IV abx per primary team  Hydronephrosis - Patient has h/o of obstructive nephrolithiasis requiring stent and lithotripsy - Concern for mild pyelo - Nephrostomy tube placed, left side  Elevated Troponin - elevated in the setting of sepsis - Troponin trend is down 2.02-->1.69-->1.37. Max hsTroponin 519->382->261. - Patient  denies chest pain or SOB - CT PE showed coronary calcifications, we will plan for CCTA once sepsis subsides and HR is improved. - Echo showed normal LVEF with apical lateral hypokinesis - EKG without specific changes - Can consider cath vs noninvasive evaluations once medical issues have improved - Continue ASA, increase metoprolol to 25 mg po BID. Can stop the heparin drip - No ACE given urinary obstruction  DM2 - SSI per IM  For questions or updates, please contact [facility] Please consult [email or web address] for contact info under      Signed, Tobias Alexander, MD  11/18/2019, 9:40 AM

## 2019-11-18 NOTE — Progress Notes (Signed)
PROGRESS NOTE    Claudia Christian  MPN:[id number] DOB: 02/05/1946 DOA: 11/15/2019 PCP: Claudia Christian, No Pcp Per  Brief Narrative:74 y.o.femalewith medical history significant ofdiabetes, hypertension, hyperlipidemia, nephrolithiasis status post stenting and lithotripsy.Claudia Christian presented initially to the outside hospital secondary to significant weakness and fatigue associated with fever. Symptoms started about 3 days ago. Claudia Christian had her husband help her in normal was called for transfer to the emergency department for persistent significant weakness. She reports having a fever up to 104F.  Outside hospital course(personally reviewed): Claudia Christian was admitted for concern for sepsis in the setting of unknown source. Claudia Christian was tested for Covid 19 twice with negative results. Claudia Christian also found to have elevated troponin while inpatient concerning for NSTEMI. CTA chest was negative for PE and significant for CAD. Claudia Christian also noted to have small bilateral pleural effusions with possible Eraxis of right lung base. Claudia Christian underwent CT urogram which was significant for moderate to severe left-sided hydro and ureteral nephrosis secondary to obstructing 1 x 0.6 cm stone at the proximal left ureter with additional nonobstructing stones seen in the lower pole of the left kidney measuring up to 1.7 cm.Claudia Christian on Ceftriaxone  Today 11/18/19 Claudia Christian reports feeling well, a bit tired, is able to walk around a bit, no chest pain or trouble breathing, no feve/chills.     Assessment & Plan:   Principal Problem:   Sepsis ([place]) Active Problems:   Hydronephrosis with renal and ureteral calculus obstruction   Hyponatremia   NSTEMI (non-ST elevated myocardial infarction) ([place])   Type 2 diabetes mellitus with hyperlipidemia ([place])   Nephrolithiasis   recurrent left ureteral stone-with mild hydronephrosis- Claudia Christian transferred from [facility] to be seen by urology and interventional radiology for  nephrostomy tube. Claudia Christian has been on Rocephin since admission to [facility]. Had fever 104 at home,TMAX 101.4 Temp 100.5 overnight T-max. Claudia Christian with history of nephrolithiasis and stenting and lithotripsy. Mild leukocytosis 10.6 Covid negative. [place] - 12/27 NEGATIVE  Urine culture initially with mixed growth. Repeat urine culture 11/16/2019 [facility] at [facility] showed yeast. Creatinine 1.09 White count slightly up 12.3 and better today at  10.6 Follow-up labs in a.m. Urology planning staged percutaneous surgery in elective setting (likely 4-6 weeks or so) after clears infectious parameters and has CV eval - see Dr Zettie Pho note 11/15/19  type 2 diabetes -134, 124, 268, 162, 123. CBG (last 3)  Recent Labs (last 2 labs)       Recent Labs    11/15/19 2110 11/16/19 0748  GLUCAP 154* 134*     She takes Glucophage 1000 mg twice a day with Januvia at home which has not been restarted.  Continue SSI.  essential hypertension blood pressure 118/86.  She was on an ACE inhibitor at home which was restarted 11/16/2019.  On metoprolol and Vasotec.  hyperlipidemia continue statin.  Crestor.  elevated troponin Claudia Christian without any symptoms unlikely to be cardiac in etiology.   CT showed no evidence of pulmonary embolism.  Echo by report normal ejection fraction with apical lateral hypokinesis.   Cardiology planning noninvasive evaluation - note unclear on CCTA vs cath, will try to clarify with them but  Cardio notes ok to d/c heparin, pt can ambulate  depression continue Zoloft.      Estimated body mass index is 27.15 kg/m as calculated from the following:   Height as of this encounter: 5\' 4"  (1.626 m).   Weight as of this encounter: 71.8 kg.  DVT prophylaxis: Heparin d/c, ok  for ambulation DVT ppx Code Status: Full code Family Communication: None  Disposition need to arrange home health for care of percutaneous nephrostomy tube, hopefully  can go tomorrow   consultants:   Cardiology, interventional radiology, urology  Procedures: None Antimicrobials: Rocephin    Objective: Vitals:   11/17/19 1455 11/17/19 2209 11/17/19 2352 11/18/19 0547  BP: 138/70 130/71 140/75   Pulse: 93 77 87 70  Resp: 19     Temp: 97.7 F (36.5 C) 98.7 F (37.1 C)  98.6 F (37 C)  TempSrc: Oral Oral  Oral  SpO2: 94% 92%  94%  Weight:      Height:        Intake/Output Summary (Last 24 hours) at 11/18/2019 1541 Last data filed at 11/18/2019 0700 Gross per 24 hour  Intake 260 ml  Output 1675 ml  Net -1415 ml   Filed Weights   11/15/19 1713 11/16/19 0432 11/17/19 0550  Weight: 74.3 kg 73.5 kg 71.8 kg    Examination:  General exam: Appears calm and comfortable  Respiratory system: Clear to auscultation. Respiratory effort normal. Cardiovascular system: S1 & S2 heard, RRR. No JVD, murmurs, rubs, gallops or clicks. No pedal edema. Gastrointestinal system: Abdomen is nondistended, soft and nontender. No organomegaly or masses felt. Normal bowel sounds heard.  Left nephrostomy tube in place draining clear urine. Central nervous system: Alert and oriented. No focal neurological deficits. Extremities: Symmetric 5 x 5 power. Skin: No rashes, lesions or ulcers Psychiatry: Judgement and insight appear normal. Mood & affect appropriate.   Clear urine in bag from percutaneous nephrostomy     Data Reviewed: I have personally reviewed following labs and imaging studies  CBC: Recent Labs  Lab 11/15/19 1834 11/16/19 0403 11/17/19 0332 11/18/19 0430  WBC 11.6* 10.6* 12.3* 10.6*  NEUTROABS  --   --  10.0*  --   HGB 9.6* 9.8* 9.7* 9.0*  HCT 29.7* 29.9* 29.4* 27.7*  MCV 89.7 89.5 87.5 88.2  PLT 329 311 340 366   Basic Metabolic Panel: Recent Labs  Lab 11/15/19 1834 11/16/19 0403 11/17/19 0332 11/18/19 0430  NA 135 138 136 138  K 4.1 4.3 3.8 3.4*  CL 107 109 104 105  CO2 21* 22 24 25   GLUCOSE 148* 186* 148* 151*  BUN 18 15 11  14   CREATININE 1.05* 1.05* 1.09* 0.93  CALCIUM 7.9* 8.1* 7.8* 8.3*   GFR: Estimated Creatinine Clearance: 53.9 mL/min (by C-G formula based on SCr of 0.93 mg/dL). Liver Function Tests: Recent Labs  Lab 11/15/19 1834 11/17/19 0332  AST 41 40  ALT 39 46*  ALKPHOS 70 84  BILITOT 0.3 0.6  PROT 5.7* 5.8*  ALBUMIN 1.9* 1.8*   No results for input(s): LIPASE, AMYLASE in the last 168 hours. No results for input(s): AMMONIA in the last 168 hours. Coagulation Profile: Recent Labs  Lab 11/15/19 1834  INR 1.1   Cardiac Enzymes: No results for input(s): CKTOTAL, CKMB, CKMBINDEX, TROPONINI in the last 168 hours. BNP (last 3 results) No results for input(s): PROBNP in the last 8760 hours. HbA1C: Recent Labs    11/15/19 1828  HGBA1C 7.3*   CBG: Recent Labs  Lab 11/17/19 1200 11/17/19 1605 11/17/19 2207 11/18/19 0744 11/18/19 1107  GLUCAP 173* 120* 180* 113* 217*   Lipid Profile: No results for input(s): CHOL, HDL, LDLCALC, TRIG, CHOLHDL, LDLDIRECT in the last 72 hours. Thyroid Function Tests: No results for input(s): TSH, T4TOTAL, FREET4, T3FREE, THYROIDAB in the last 72 hours. Anemia Panel: No  results for input(s): VITAMINB12, FOLATE, FERRITIN, TIBC, IRON, RETICCTPCT in the last 72 hours. Sepsis Labs: No results for input(s): PROCALCITON, LATICACIDVEN in the last 168 hours.  Recent Results (from the past 240 hour(s))  SARS CORONAVIRUS 2 (TAT 6-24 HRS) Nasopharyngeal Nasopharyngeal Swab     Status: None   Collection Time: 11/15/19  5:17 PM   Specimen: Nasopharyngeal Swab  Result Value Ref Range Status   SARS Coronavirus 2 NEGATIVE NEGATIVE Final    Comment: (NOTE) SARS-CoV-2 target nucleic acids are NOT DETECTED. The SARS-CoV-2 RNA is generally detectable in upper and lower respiratory specimens during the acute phase of infection. Negative results do not preclude SARS-CoV-2 infection, do not rule out co-infections with other pathogens, and should not be used as  the sole basis for treatment or other Claudia Christian management decisions. Negative results must be combined with clinical observations, Claudia Christian history, and epidemiological information. The expected result is Negative. Fact Sheet for Patients: HairSlick.no Fact Sheet for Healthcare Providers: quierodirigir.com This test is not yet approved or cleared by the [place] FDA and  has been authorized for detection and/or diagnosis of SARS-CoV-2 by FDA under an Emergency Use Authorization (EUA). This EUA will remain  in effect (meaning this test can be used) for the duration of the COVID-19 declaration under Section 56 4(b)(1) of the Act, 21 U.S.C. section 360bbb-3(b)(1), unless the authorization is terminated or revoked sooner. Performed at [facility], [address], [place], [place] [address]   Urine culture     Status: Abnormal   Collection Time: 11/16/19 12:42 PM   Specimen: Kidney; Urine  Result Value Ref Range Status   Specimen Description KIDNEY LEFT  Final   Special Requests   Final    Normal Performed at [facility], [address] [place], [place] [address]    Culture 30,000 COLONIES/mL YEAST (A)  Final   Report Status 11/17/2019 FINAL  Final         Radiology Studies: No results found.      Scheduled Meds: . aspirin EC  81 mg Oral Daily  . enalapril  20 mg Oral Daily  . insulin aspart  0-9 Units Subcutaneous TID WC  . metoprolol tartrate  25 mg Oral BID  . sertraline  50 mg Oral Daily  . sodium chloride flush  5 mL Intracatheter Q8H   Continuous Infusions: . cefTRIAXone (ROCEPHIN)  IV 1 g (11/18/19 0941)     LOS: 3 days     Sunnie Nielsen, MD [facility]  If 7PM-7AM, please contact night-coverage [email or web address] Password [place] Day 11/18/2019, 3:41 PM

## 2019-11-18 NOTE — Progress Notes (Signed)
ANTICOAGULATION CONSULT NOTE   Pharmacy Consult for Heparin Indication: chest pain/ACS  No Known Allergies  Patient Measurements: Height: 5\' 4"  (162.6 cm) Weight: 158 lb 3.2 oz (71.8 kg) IBW/kg (Calculated) : 54.7 Heparin Dosing Weight: 70kg Vital Signs: Temp: 98.7 F (37.1 C) (12/31 2209) Temp Source: Oral (12/31 2209) BP: 140/75 (12/31 2352) Pulse Rate: 87 (12/31 2352)  Labs: Recent Labs    11/15/19 1834 11/15/19 1834 11/16/19 0403 11/16/19 0825 11/16/19 1306 11/17/19 0332 11/17/19 0753 11/17/19 0924 11/17/19 1948 11/18/19 0430  HGB 9.6*  --  9.8*  --   --  9.7*  --   --   --  9.0*  HCT 29.7*  --  29.9*  --   --  29.4*  --   --   --  27.7*  PLT 329  --  311  --   --  340  --   --   --  366  LABPROT 14.1  --   --   --   --   --   --   --   --   --   INR 1.1  --   --   --   --   --   --   --   --   --   HEPARINUNFRC  --    < > 0.15*  --   --   --   --  0.12* 0.16* 0.25*  CREATININE 1.05*  --  1.05*  --   --  1.09*  --   --   --  0.93  TROPONINIHS  --   --   --  519* 382*  --  261*  --   --   --    < > = values in this interval not displayed.    Estimated Creatinine Clearance: 53.9 mL/min (by C-G formula based on SCr of 0.93 mg/dL).   Medical History: Past Medical History:  Diagnosis Date  . Diabetes mellitus type 2, controlled (HCC)   . Essential hypertension   . Hyperlipidemia   . Nephrolithiasis     Assessment: 74 year old female transferred from [facility] hospital to [facility] for further evaluation including IR, cardiology, and urology.  Patient with elevated cardiac enzymes at [facility], she was receiving full dose lovenox then transitioned to IV Heparin - stopped 12/30 for L-PCN and resumed post-op.   Heparin level this evening remains SUBtherapeutic despite a rate increase earlier today (HL 0.16 << 0.12, goal of 0.3-0.7). No bleeding or issues noted per RN - nephrostomy tube with serous drainage but not blood-tinged.   1/1 AM update:  Heparin level remains  sub-therapeutic at 0.25 No issues per RN  Goal of Therapy:  Heparin level 0.3-0.7 units/ml Monitor platelets by anticoagulation protocol: Yes   Plan:  - Increase Heparin to 2100 units/hr  - Re-check heparin level in 6-8 hours  1/31, PharmD, BCPS Clinical Pharmacist Phone: [phone]

## 2019-11-19 ENCOUNTER — Inpatient Hospital Stay (HOSPITAL_COMMUNITY): Payer: Medicare Other

## 2019-11-19 DIAGNOSIS — I2510 Atherosclerotic heart disease of native coronary artery without angina pectoris: Secondary | ICD-10-CM | POA: Diagnosis not present

## 2019-11-19 LAB — BASIC METABOLIC PANEL
Anion gap: 10 (ref 5–15)
BUN: 11 mg/dL (ref 8–23)
CO2: 26 mmol/L (ref 22–32)
Calcium: 8.6 mg/dL — ABNORMAL LOW (ref 8.9–10.3)
Chloride: 102 mmol/L (ref 98–111)
Creatinine, Ser: 0.96 mg/dL (ref 0.44–1.00)
GFR calc Af Amer: >60 mL/min (ref >60)
GFR calc non Af Amer: 60 mL/min — ABNORMAL LOW (ref >60)
Glucose, Bld: 144 mg/dL — ABNORMAL HIGH (ref 70–99)
Potassium: 3.7 mmol/L (ref 3.5–5.1)
Sodium: 138 mmol/L (ref 135–145)

## 2019-11-19 LAB — CBC
HCT: 29.2 % — ABNORMAL LOW (ref 36.0–46.0)
Hemoglobin: 9.3 g/dL — ABNORMAL LOW (ref 12.0–15.0)
MCH: 28.6 pg (ref 26.0–34.0)
MCHC: 31.8 g/dL (ref 30.0–36.0)
MCV: 89.8 fL (ref 80.0–100.0)
Platelets: 372 10*3/uL (ref 150–400)
RBC: 3.25 MIL/uL — ABNORMAL LOW (ref 3.87–5.11)
RDW: 13.2 % (ref 11.5–15.5)
WBC: 9.1 10*3/uL (ref 4.0–10.5)
nRBC: 0 % (ref 0.0–0.2)

## 2019-11-19 LAB — GLUCOSE, CAPILLARY
Glucose-Capillary: 127 mg/dL — ABNORMAL HIGH (ref 70–99)
Glucose-Capillary: 209 mg/dL — ABNORMAL HIGH (ref 70–99)

## 2019-11-19 MED ORDER — NITROGLYCERIN 0.4 MG SL SUBL
0.8000 mg | SUBLINGUAL_TABLET | Freq: Once | SUBLINGUAL | Status: AC
  Administered 2019-11-19: 0.8 mg via SUBLINGUAL

## 2019-11-19 MED ORDER — ONDANSETRON HCL 4 MG PO TABS: 4.0000 mg | ORAL_TABLET | Freq: Four times a day (QID) | ORAL | 0 refills | Status: DC | PRN

## 2019-11-19 MED ORDER — ATORVASTATIN CALCIUM 40 MG PO TABS: 40.0000 mg | ORAL_TABLET | Freq: Every day | ORAL | 0 refills | Status: DC

## 2019-11-19 MED ORDER — ACETAMINOPHEN 325 MG PO TABS: 650.0000 mg | ORAL_TABLET | Freq: Four times a day (QID) | ORAL | 0 refills | Status: AC | PRN

## 2019-11-19 MED ORDER — METOPROLOL TARTRATE 25 MG PO TABS: 25.0000 mg | ORAL_TABLET | Freq: Two times a day (BID) | ORAL | 0 refills | Status: DC

## 2019-11-19 MED ORDER — ASPIRIN 81 MG PO TBEC: 81.0000 mg | DELAYED_RELEASE_TABLET | Freq: Every day | ORAL | 0 refills | Status: AC

## 2019-11-19 MED ORDER — CEFDINIR 300 MG PO CAPS: 300.0000 mg | ORAL_CAPSULE | Freq: Two times a day (BID) | ORAL | 0 refills | Status: AC

## 2019-11-19 MED ORDER — METOPROLOL TARTRATE 100 MG PO TABS
100.0000 mg | ORAL_TABLET | Freq: Once | ORAL | Status: AC
  Administered 2019-11-19: 100 mg via ORAL
  Filled 2019-11-19: qty 1

## 2019-11-19 MED ORDER — POLYETHYLENE GLYCOL 3350 17 G PO PACK: 17.0000 g | PACK | Freq: Every day | ORAL | 0 refills | Status: AC | PRN

## 2019-11-19 MED ORDER — NITROGLYCERIN 0.4 MG SL SUBL
SUBLINGUAL_TABLET | SUBLINGUAL | Status: AC
  Filled 2019-11-19: qty 2

## 2019-11-19 MED ORDER — FLUCONAZOLE 150 MG PO TABS: 150.0000 mg | ORAL_TABLET | ORAL | 0 refills | Status: AC

## 2019-11-19 MED ORDER — IOHEXOL 350 MG/ML SOLN
80.0000 mL | Freq: Once | INTRAVENOUS | Status: AC | PRN
  Administered 2019-11-19: 80 mL via INTRAVENOUS

## 2019-11-19 NOTE — Progress Notes (Signed)
Radiologist confirmed with pt that nephrostomy tube only need to be flushed 1-2 times/ week as long as it's draining out. Pt should be ok until [facility] comes in on Monday. Pt's husband at bedside.

## 2019-11-19 NOTE — TOC Transition Note (Signed)
Transition of Care [facility]) - CM/SW Discharge Note   Patient Details  Name: Claudia Christian MRN: [id number] Date of Birth: [birth year]  Transition of [facility]) CM/SW Contact:  Deveron Furlong, RN 11/19/2019, 4:56 PM   Clinical Narrative:    Patient to dc home with [facility] RN to help care for nephrostomy tube.  Patient states she and husband can manage the tube in between [facility] RN visits.    Referral accepted by Cassie at [facility].  Information added to patient's AVS.      Final next level of care: Home w Home Health Services Barriers to Discharge: No Barriers Identified   Patient Goals and CMS Choice Patient states their goals for this hospitalization and ongoing recovery are:: to go home CMS Medicare.gov Compare Post Acute Care list provided to:: Patient Choice offered to / list presented to : Patient   Discharge Plan and Services     HH Arranged: RN [facility] Agency: [facility] Date [facility] Agency Contacted: 11/19/19 Time HH Agency Contacted: 1655 Representative spoke with at [facility] Agency: Cassie

## 2019-11-19 NOTE — Progress Notes (Signed)
Patient seen and examined this AM Reviewed cardiology note and urology note I'm ok for patient to go pending CCTA read by cardiology Will plan for discharge hopefully later today

## 2019-11-19 NOTE — Progress Notes (Addendum)
Progress Note  Patient Name: Claudia Christian Date of Encounter: 11/19/2019  Primary Cardiologist: Jenean Lindau, MD   Subjective   Patient had PCN placed on 12/20, she will be discharged today, denies chest pain. No fevers in the last 48 hours.  Inpatient Medications    Scheduled Meds: . aspirin EC  81 mg Oral Daily  . enalapril  20 mg Oral Daily  . insulin aspart  0-9 Units Subcutaneous TID WC  . metoprolol tartrate  25 mg Oral BID  . sertraline  50 mg Oral Daily  . sodium chloride flush  5 mL Intracatheter Q8H   Continuous Infusions: . cefTRIAXone (ROCEPHIN)  IV 1 g (11/18/19 0941)   PRN Meds: acetaminophen **OR** acetaminophen, metoprolol tartrate, ondansetron **OR** ondansetron (ZOFRAN) IV, polyethylene glycol   Vital Signs    Vitals:   11/18/19 0547 11/18/19 1724 11/18/19 2125 11/19/19 0529  BP:  133/79 (!) 147/83 (!) 141/76  Pulse: 70 76 73 69  Resp:  16    Temp: 98.6 F (37 C) 98.1 F (36.7 C) 97.6 F (36.4 C) 98.3 F (36.8 C)  TempSrc: Oral Oral Oral Oral  SpO2: 94% 97% 96% 92%  Weight:    69.9 kg  Height:        Intake/Output Summary (Last 24 hours) at 11/19/2019 0725 Last data filed at 11/18/2019 0941 Gross per 24 hour  Intake 200 ml  Output --  Net 200 ml   Last 3 Weights 11/19/2019 11/17/2019 11/16/2019  Weight (lbs) 154 lb 158 lb 3.2 oz 162 lb 0.6 oz  Weight (kg) 69.854 kg 71.759 kg 73.5 kg      Telemetry    NSR with PACs, HR 80s - Personally Reviewed  ECG    NSR, 87 bpm, PAC, nonspecific T wave changes, Qt 471 ms, poor R wave progression - Personally Reviewed  Physical Exam   GEN: No acute distress.   Neck: No JVD Cardiac: RRR, no murmurs, rubs, or gallops.  Respiratory: Clear to auscultation bilaterally. GI: Soft, nontender, non-distended  MS: No edema; No deformity. Neuro:  Nonfocal  Psych: Normal affect   Labs    High Sensitivity Troponin:   Recent Labs  Lab 11/16/19 0825 11/16/19 1306 11/17/19 0753  TROPONINIHS 519*  382* 261*      Chemistry Recent Labs  Lab 11/15/19 1834 11/17/19 0332 11/18/19 0430 11/19/19 0313  NA 135 136 138 138  K 4.1 3.8 3.4* 3.7  CL 107 104 105 102  CO2 21* 24 25 26   GLUCOSE 148* 148* 151* 144*  BUN 18 11 14 11   CREATININE 1.05* 1.09* 0.93 0.96  CALCIUM 7.9* 7.8* 8.3* 8.6*  PROT 5.7* 5.8*  --   --   ALBUMIN 1.9* 1.8*  --   --   AST 41 40  --   --   ALT 39 46*  --   --   ALKPHOS 70 84  --   --   BILITOT 0.3 0.6  --   --   GFRNONAA 53* 51* >60 60*  GFRAA >60 59* >60 >60  ANIONGAP 7 8 8 10      Hematology Recent Labs  Lab 11/17/19 0332 11/18/19 0430 11/19/19 0313  WBC 12.3* 10.6* 9.1  RBC 3.36* 3.14* 3.25*  HGB 9.7* 9.0* 9.3*  HCT 29.4* 27.7* 29.2*  MCV 87.5 88.2 89.8  MCH 28.9 28.7 28.6  MCHC 33.0 32.5 31.8  RDW 13.3 13.4 13.2  PLT 340 366 372    BNPNo results for input(s):  BNP, PROBNP in the last 168 hours.   DDimer No results for input(s): DDIMER in the last 168 hours.   Radiology    No results found.  Cardiac Studies   Echo from [facility] Normal global LV contractility Overall LV systolic function is normal, EF 55-60% Apical lateral LV wall motion is hypokinetic  Patient Profile     74 y.o. female with a hx of DM2, HTN, HL, kidney stones with prior interventions admitted to [facility] with weakness, fatigue and fever who is being seen for elevated troponin.  Assessment & Plan    Sepsis - Renal source - COVID negative - Blood cultures negative on arrival from [place] - IV abx per primary team  Hydronephrosis - Patient has h/o of obstructive nephrolithiasis requiring stent and lithotripsy - Concern for mild pyelo - Nephrostomy tube placed, left side  Elevated Troponin - elevated in the setting of sepsis - Troponin trend is down 2.02-->1.69-->1.37. Max hsTroponin 519->382->261. - Patient denies chest pain or SOB - CT PE showed coronary calcifications, we will plan for CCTA once sepsis subsides and HR is improved. -  Echo showed normal LVEF with apical lateral hypokinesis - EKG without specific changes - we will order Coronary CTA this am, metoprolol 100 mg po x 1 for HR control - Continue ASA, increase metoprolol to 25 mg po BID. Can stop the heparin drip - No ACE given urinary obstruction  DM2 - SSI per IM  For questions or updates, please contact [facility] Please consult [email or web address] for contact info under      Signed, Tobias Alexander, MD  11/19/2019, 7:25 AM    Addendum:  Coronary CTA showed: 1. Coronary calcium score of 439. This was 57 percentile for age and sex matched control. 2. CAD-RADS 3. Moderate stenosis in the distal LAD. Consider symptom-guided anti-ischemic pharmacotherapy as well as risk factor modification.  We will start her on atorvastatin 40 mg po daily, and arrange for an outpatient follow up. We will send for CT FFR, if abnormal, we will arrange for a cardiac cath at the later date once her infection is resolved.  Tobias Alexander, MD 11/19/2019

## 2019-11-19 NOTE — Progress Notes (Signed)
Pt stable, dc home with family via wheelchair 

## 2019-11-19 NOTE — Discharge Summary (Signed)
Physician Discharge Summary  Patient ID: Claudia Christian MRN: [id number] DOB/AGE: 1945-12-28 74 y.o.  Admit date: 11/15/2019 Discharge date: 11/19/2019  Admission Diagnoses: Sepsis unknown source NSTEMI   Discharge Diagnoses:  Principal Problem:   Sepsis (HCC) Active Problems:   Hydronephrosis with renal and ureteral calculus obstruction   Hyponatremia   NSTEMI (non-ST elevated myocardial infarction) (HCC)   Type 2 diabetes mellitus with hyperlipidemia (HCC)   Nephrolithiasis   Discharged Condition: good  Hospital Course:  74 y.o.femalewith medical history significant ofdiabetes, hypertension, hyperlipidemia, nephrolithiasis status post stenting and lithotripsy.Patient presented initially to the outside hospital secondary to significant weakness and fatigue associated with fever. Symptoms started about 3 days PTA. She reports having a fever up to 104F. Patient was admitted for concern for sepsis in the setting of unknown source.   Patient was tested for Covid 19 twice with negative results.   Cardiac:  Patient also found to have elevated troponin while inpatient concerning for NSTEMI. CTA chest was negative for PE and significant for CAD. Patient also noted to have small bilateral pleural effusions with possible Eraxis of right lung base. On date of discharge, cardiology performed CCTA which showed:  Coronary CTA 11/19/19:  1. Coronary calcium score of 439. This was 9 percentile for  age and sex matched control.  2. CAD-RADS 3. Moderate stenosis in the distal LAD.  Consider  symptom-guided anti-ischemic pharmacotherapy as well as  risk factor modification.  Urologic: Patient underwent CT urogram which was significant for moderate to severe left-sided hydro and ureteral nephrosis secondary to obstructing 1 x 0.6 cm stone at the proximal left ureter with additional nonobstructing stones seen in the lower pole of the left kidney measuring up to 1.7 cm.Neph tube placed  11/16/19.  Patient kept on Ceftriaxone and continued to clinically improve.     Consults: cardiology and urology  Significant Diagnostic Studies:  CTA CCTA (cardiac calcification)  Treatments: IV hydration, antibiotics: ceftriaxone, percutenous nephrostomy tube L  Discharge Exam: Blood pressure 138/83, pulse 69, temperature (!) 97.4 F (36.3 C), temperature source Oral, resp. rate 16, height 5\' 4"  (1.626 m), weight 69.9 kg, SpO2 100 %. Constitutional:  . VSS, see nurse notes . General Appearance: alert, well-developed, well-nourished, NAD Eyes: Normal lids and conjunctive, non-icteric sclera Ears, Nose, Mouth, Throat: . Normal appearance Neck: . No masses, trachea midline Respiratory: . Normal respiratory effort . Breath sounds normal, no wheeze/rhonchi/rales Cardiovascular: . S1/S2 normal, no murmur/rub/gallop auscultated . No carotid bruit or JVD . No lower extremity edema Gastrointestinal: . Nontender, no masses Neurological: . No cranial nerve deficit on limited exam Psychiatric: . Normal judgment/insight . Normal mood and affect   Disposition: Discharge disposition: 01-Home or Self Care       Discharge Instructions    Call MD for:   Complete by: As directed    Call MD for:  difficulty breathing, headache or visual disturbances   Complete by: As directed    Call MD for:  persistant nausea and vomiting   Complete by: As directed    Call MD for:  redness, tenderness, or signs of infection (pain, swelling, redness, odor or green/yellow discharge around incision site)   Complete by: As directed    Call MD for:  severe uncontrolled pain   Complete by: As directed    Call MD for:  temperature >100.4   Complete by: As directed    Diet - low sodium heart healthy   Complete by: As directed    Discharge instructions  Complete by: As directed    Follow up as directed with  Cardiology - Phone: [phone] Urology - Phone: [phone]   Discharge  wound care:   Complete by: As directed    Home health has been arranged   Increase activity slowly   Complete by: As directed      Allergies as of 11/19/2019   No Known Allergies     Medication List    TAKE these medications   acetaminophen 325 MG tablet Commonly known as: TYLENOL Take 2 tablets (650 mg total) by mouth every 6 (six) hours as needed for mild pain (or Fever >/= 101).   alendronate 70 MG tablet Commonly known as: FOSAMAX Take 70 mg by mouth once a week. Take with a full glass of water on an empty stomach.   aspirin 81 MG EC tablet Take 1 tablet (81 mg total) by mouth daily. Start taking on: November 20, 2019   enalapril 20 MG tablet Commonly known as: VASOTEC Take 20 mg by mouth daily.   metFORMIN 1000 MG tablet Commonly known as: GLUCOPHAGE Take 1,000 mg by mouth 2 (two) times daily with a meal.   metoprolol tartrate 25 MG tablet Commonly known as: LOPRESSOR Take 1 tablet (25 mg total) by mouth 2 (two) times daily.   ondansetron 4 MG tablet Commonly known as: ZOFRAN Take 1 tablet (4 mg total) by mouth every 6 (six) hours as needed for nausea.   polyethylene glycol 17 g packet Commonly known as: MIRALAX / GLYCOLAX Take 17 g by mouth daily as needed for mild constipation.   rosuvastatin 10 MG tablet Commonly known as: CRESTOR Take 10 mg by mouth daily.   sertraline 50 MG tablet Commonly known as: ZOLOFT Take 50 mg by mouth daily.   sitaGLIPtin 25 MG tablet Commonly known as: JANUVIA Take 25 mg by mouth daily.            Discharge Care Instructions  (From admission, onward)         Start     Ordered   11/19/19 0000  Discharge wound care:    Comments: Home health has been arranged   11/19/19 1759         Follow-up Information    Alexis Frock, MD.   Specialty: Urology Why: Office will call to arrange MD visit in abtou 2 weeks to discuss furher stone management.  Contact information: [address] [place]  [address] [phone]        [facility] Follow up.   Specialty: Home Health Services Why: RN from this agency will contact you to schedule visit for Monday or Tuesday.   Contact information: [place] [phone]           Signed: Emeterio Reeve 11/19/2019, 6:00 PM

## 2019-11-19 NOTE — Progress Notes (Signed)
   Subjective/Chief Complaint:  1 - Recurrent Urolithiasis -  Pre 2020 - URS x 1, SWL x 1 in [place]  10/2019 Large Left Ureteral + Partial Staghorn Stone - 1cm left UPJ + 3cm lower pole stones (1100HU) by CT at [facility] 11/14/19. Neph tube placed 11/16/19.   2 -  Bacteruria / Funguria - bacteruria with some concern of mild pyelo per report from referring hospital. Placed in empiric rocephin. UCX 12/30 yeast only.  She is without high fevers, non-tachycardic here.  Today " Claudia Christian " is stbale. NO fevers. Neph tube in good position. Likely DC today as long as no major issues on cardiac CT.    Objective: Vital signs in last 24 hours: Temp:  [97.6 F (36.4 C)-98.3 F (36.8 C)] 98.3 F (36.8 C) (01/02 0529) Pulse Rate:  [69-76] 69 (01/02 0529) Resp:  [16] 16 (01/01 1724) BP: (133-147)/(76-83) 141/76 (01/02 0529) SpO2:  [92 %-97 %] 92 % (01/02 0529) Weight:  [69.9 kg] 69.9 kg (01/02 0529) Last BM Date: 11/17/19  Intake/Output from previous day: 01/01 0701 - 01/02 0700 In: 200 [IV Piggyback:200] Out: -  Intake/Output this shift: No intake/output data recorded.  General appearance: alert, cooperative and appears stated age Eyes: negative Nose: Nares normal. Septum midline. Mucosa normal. No drainage or sinus tenderness. Throat: lips, mucosa, and tongue normal; teeth and gums normal Neck: supple, symmetrical, trachea midline Back: symmetric, no curvature. ROM normal. No CVA tenderness. Resp: Non-labred on room air.  Cardio: Nl rate GI: soft, non-tender; bowel sounds normal; no masses,  no organomegaly Pelvic: external genitalia normal and left neph tube in place w/o hematoma.  Extremities: extremities normal, atraumatic, no cyanosis or edema Skin: Skin color, texture, turgor normal. No rashes or lesions Lymph nodes: Cervical, supraclavicular, and axillary nodes normal. Neurologic: Grossly normal  Lab Results:  Recent Labs    11/18/19 0430 11/19/19 0313  WBC 10.6*  9.1  HGB 9.0* 9.3*  HCT 27.7* 29.2*  PLT 366 372   BMET Recent Labs    11/18/19 0430 11/19/19 0313  NA 138 138  K 3.4* 3.7  CL 105 102  CO2 25 26  GLUCOSE 151* 144*  BUN 14 11  CREATININE 0.93 0.96  CALCIUM 8.3* 8.6*   PT/INR No results for input(s): LABPROT, INR in the last 72 hours. ABG No results for input(s): PHART, HCO3 in the last 72 hours.  Invalid input(s): PCO2, PO2  Studies/Results: No results found.  Anti-infectives: Anti-infectives (From admission, onward)   Start     Dose/Rate Route Frequency Ordered Stop   11/16/19 0800  cefTRIAXone (ROCEPHIN) 1 g in sodium chloride 0.9 % 100 mL IVPB     1 g 200 mL/hr over 30 Minutes Intravenous Every 24 hours 11/15/19 1819        Assessment/Plan:  Left kidney now drained. She will need major surgery for stone removal in elective setting. WE will arrange Urology follow up in outpatient setting. Current neph tube to remain. Please call me directly with questions anytime.   Sebastian Ache 11/19/2019

## 2019-11-20 ENCOUNTER — Other Ambulatory Visit (HOSPITAL_COMMUNITY): Payer: Self-pay | Admitting: Cardiology

## 2019-11-20 ENCOUNTER — Ambulatory Visit (HOSPITAL_COMMUNITY)
Admission: RE | Admit: 2019-11-20 | Discharge: 2019-11-20 | Disposition: A | Discharge status: Home / Self Care | Payer: Medicare PPO | Source: Ambulatory Visit | Attending: Cardiology | Admitting: Cardiology

## 2019-11-20 DIAGNOSIS — R918 Other nonspecific abnormal finding of lung field: Secondary | ICD-10-CM | POA: Insufficient documentation

## 2019-11-20 DIAGNOSIS — I700 Atherosclerosis of aorta: Secondary | ICD-10-CM | POA: Insufficient documentation

## 2019-11-20 DIAGNOSIS — R079 Chest pain, unspecified: Secondary | ICD-10-CM

## 2019-11-20 DIAGNOSIS — I2510 Atherosclerotic heart disease of native coronary artery without angina pectoris: Secondary | ICD-10-CM | POA: Insufficient documentation

## 2019-11-20 DIAGNOSIS — J90 Pleural effusion, not elsewhere classified: Secondary | ICD-10-CM | POA: Insufficient documentation

## 2019-11-21 ENCOUNTER — Telehealth (HOSPITAL_COMMUNITY): Payer: Self-pay | Admitting: Emergency Medicine

## 2019-11-21 NOTE — Telephone Encounter (Signed)
Entered in error

## 2019-11-22 DIAGNOSIS — I2510 Atherosclerotic heart disease of native coronary artery without angina pectoris: Secondary | ICD-10-CM | POA: Diagnosis not present

## 2019-11-23 DIAGNOSIS — A419 Sepsis, unspecified organism: Secondary | ICD-10-CM | POA: Diagnosis not present

## 2019-11-23 DIAGNOSIS — Z7982 Long term (current) use of aspirin: Secondary | ICD-10-CM | POA: Diagnosis not present

## 2019-11-23 DIAGNOSIS — N132 Hydronephrosis with renal and ureteral calculous obstruction: Secondary | ICD-10-CM | POA: Diagnosis not present

## 2019-11-23 DIAGNOSIS — Z7984 Long term (current) use of oral hypoglycemic drugs: Secondary | ICD-10-CM | POA: Diagnosis not present

## 2019-11-23 DIAGNOSIS — N131 Hydronephrosis with ureteral stricture, not elsewhere classified: Secondary | ICD-10-CM | POA: Diagnosis not present

## 2019-11-23 DIAGNOSIS — I214 Non-ST elevation (NSTEMI) myocardial infarction: Secondary | ICD-10-CM | POA: Diagnosis not present

## 2019-11-23 DIAGNOSIS — I10 Essential (primary) hypertension: Secondary | ICD-10-CM | POA: Diagnosis not present

## 2019-11-23 DIAGNOSIS — E119 Type 2 diabetes mellitus without complications: Secondary | ICD-10-CM | POA: Diagnosis not present

## 2019-11-28 ENCOUNTER — Encounter: Payer: Self-pay | Admitting: Cardiology

## 2019-11-28 ENCOUNTER — Telehealth: Payer: Self-pay

## 2019-11-28 ENCOUNTER — Ambulatory Visit (INDEPENDENT_AMBULATORY_CARE_PROVIDER_SITE_OTHER): Payer: Medicare PPO | Admitting: Cardiology

## 2019-11-28 ENCOUNTER — Other Ambulatory Visit: Payer: Self-pay

## 2019-11-28 VITALS — BP 130/70 | HR 64 | Ht 64.0 in | Wt 149.0 lb

## 2019-11-28 DIAGNOSIS — D649 Anemia, unspecified: Secondary | ICD-10-CM

## 2019-11-28 DIAGNOSIS — Z1329 Encounter for screening for other suspected endocrine disorder: Secondary | ICD-10-CM

## 2019-11-28 DIAGNOSIS — E1169 Type 2 diabetes mellitus with other specified complication: Secondary | ICD-10-CM

## 2019-11-28 DIAGNOSIS — I2510 Atherosclerotic heart disease of native coronary artery without angina pectoris: Secondary | ICD-10-CM | POA: Diagnosis not present

## 2019-11-28 DIAGNOSIS — N132 Hydronephrosis with renal and ureteral calculous obstruction: Secondary | ICD-10-CM | POA: Diagnosis not present

## 2019-11-28 DIAGNOSIS — E785 Hyperlipidemia, unspecified: Secondary | ICD-10-CM | POA: Diagnosis not present

## 2019-11-28 DIAGNOSIS — I214 Non-ST elevation (NSTEMI) myocardial infarction: Secondary | ICD-10-CM

## 2019-11-28 HISTORY — DX: Anemia, unspecified: D64.9

## 2019-11-28 HISTORY — DX: Atherosclerotic heart disease of native coronary artery without angina pectoris: I25.10

## 2019-11-28 NOTE — Progress Notes (Signed)
Cardiology Office Note:    Date:  11/28/2019   ID:  Claudia Christian, DOB Oct 10, 1946, MRN [id number]  PCP:  Buckner Malta, MD  Cardiologist:  Garwin Brothers, MD   Referring MD: No ref. provider found    ASSESSMENT:    1. Type 2 diabetes mellitus with hyperlipidemia (HCC)   2. Coronary arteriosclerosis   3. NSTEMI (non-ST elevated myocardial infarction) (HCC)   4. Hydronephrosis with renal and ureteral calculus obstruction   5. Anemia, unspecified type    PLAN:    In order of problems listed above:  1. 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  Her findings are worrisome.  She has been recommended for coronary angiography at hospital discharge but the doctors have been reluctant to do this because of urosepsis which appears to have cleared now.I discussed coronary angiography and left heart catheterization with the patient at extensive length. Procedure, benefits and potential risks were explained. Patient had multiple questions which were answered to the patient's satisfaction. Patient agreed and consented for the procedure. Further recommendations will be made based on the findings of the coronary angiography. In the interim. The patient has any significant symptoms he knows to go to the nearest emergency room. 2. We will do blood work today including Chem-7 CBC.  She is anemic and I would like to know that status.  She denies any blood loss from her stools or any such issues. 3. I had an extensive discussion with her urologist Dr. Berneice Heinrich and he mentioned to me that it will be months before they plan any invasive surgery for her nephrolithiasis issue.  I think in the interim it would be prudent to attend to coronary artery disease and she may be a candidate for something like a bare-metal stent if surgery is to be planned in the next couple of  months.  I will let my interventional colleagues take a call on this issue.  I discussed the entire issue with her at extensive length and questions were answered to her satisfaction.  Total time for this evaluation was 45 minutes.   Medication Adjustments/Labs and Tests Ordered: Current medicines are reviewed at length with the patient today.  Concerns regarding medicines are outlined above.  No orders of the defined types were placed in this encounter.  No orders of the defined types were placed in this encounter.    Chief Complaint  Patient presents with  . Transitions Of Care    At [facility] and then to [facility]     History of Present Illness:    Claudia Christian is a 74 y.o. female.  Patient was in the [facility] and [facility] to [facility].  She has history of essential hypertension  and diabetes mellitus.  The patient had a non-STEMI.  Subsequently she has been diagnosed to have hydronephrosis and has a catheter drain.  She is planning to undergo surgery at the end of the month for this according to the patient.  Her CT FFR revealed significant proximal to mid LAD stenosis and the details are mentioned below.  Is very concerning.  She is here for follow-up.  Is been about a week since since hospital discharge.  At the time of my evaluation, the patient is alert awake oriented and in no distress.  Past Medical History:  Diagnosis Date  . Diabetes mellitus type 2, controlled (HCC)   .  Essential hypertension   . Hyperlipidemia   . Nephrolithiasis     Past Surgical History:  Procedure Laterality Date  . IR NEPHROSTOMY PLACEMENT LEFT  11/16/2019  . LITHOTRIPSY    . TUBAL LIGATION  1984    Current Medications: Current Meds  Medication Sig  . acetaminophen (TYLENOL) 325 MG tablet Take 2 tablets (650 mg total) by mouth every 6 (six) hours as needed for mild pain (or Fever >/= 101).  . alendronate (FOSAMAX) 70 MG tablet Take 70 mg by mouth once a week. Take with a full  glass of water on an empty stomach.  . aspirin EC 81 MG EC tablet Take 1 tablet (81 mg total) by mouth daily.  . atorvastatin (LIPITOR) 40 MG tablet Take 1 tablet (40 mg total) by mouth daily.  . enalapril (VASOTEC) 20 MG tablet Take 20 mg by mouth daily.  . metFORMIN (GLUCOPHAGE) 1000 MG tablet Take 1,000 mg by mouth 2 (two) times daily with a meal.  . metoprolol tartrate (LOPRESSOR) 25 MG tablet Take 1 tablet (25 mg total) by mouth 2 (two) times daily. (Patient taking differently: Take 25 mg by mouth daily. )  . polyethylene glycol (MIRALAX / GLYCOLAX) 17 g packet Take 17 g by mouth daily as needed for mild constipation.  . sertraline (ZOLOFT) 50 MG tablet Take 50 mg by mouth daily.  . sitaGLIPtin (JANUVIA) 25 MG tablet Take 25 mg by mouth daily.  . vitamin B-12 (CYANOCOBALAMIN) 1000 MCG tablet Take 1,000 mcg by mouth daily.     Allergies:   Patient has no known allergies.   Social History   Socioeconomic History  . Marital status: Married    Spouse name: Not on file  . Number of children: Not on file  . Years of education: Not on file  . Highest education level: Not on file  Occupational History  . Not on file  Tobacco Use  . Smoking status: Never Smoker  . Smokeless tobacco: Never Used  Substance and Sexual Activity  . Alcohol use: Never  . Drug use: Never  . Sexual activity: Yes    Partners: Male  Other Topics Concern  . Not on file  Social History Narrative  . Not on file   Social Determinants of Health   Financial Resource Strain:   . Difficulty of Paying Living Expenses: Not on file  Food Insecurity:   . Worried About Running Out of Food in the Last Year: Not on file  . Ran Out of Food in the Last Year: Not on file  Transportation Needs:   . Lack of Transportation (Medical): Not on file  . Lack of Transportation (Non-Medical): Not on file  Physical Activity:   . Days of Exercise per Week: Not on file  . Minutes of Exercise per Session: Not on file  Stress:     . Feeling of Stress : Not on file  Social Connections:   . Frequency of Communication with Friends and Family: Not on file  . Frequency of Social Gatherings with Friends and Family: Not on file  . Attends Religious Services: Not on file  . Active Member of Clubs or Organizations: Not on file  . Attends Club or Organization Meetings: Not on file  . Marital Status: Not on file     Family History: The patient's family history includes Heart attack in her brother, father, and mother.  ROS:   Please see the history of present illness.    All other systems reviewed   and are negative.  EKGs/Labs/Other Studies Reviewed:    The following studies were reviewed today: CT CORONARY FRACTIONAL FLOW RESERVE DATA PREP (Accession [id number]) (Order [id number]) Imaging Date: 11/20/2019 Department: [facility] Released By: Espinosa, Westley P, RT (auto-released) Authorizing: Nelson, Katarina H, MD  Exam Status  Status  Final [99]  Study Result  EXAM: CT FFR ANALYSIS  CLINICAL DATA:  73-year-old female with abnormal CCTA.  FINDINGS: FFRct analysis was performed on the original cardiac CT angiogram dataset. Diagrammatic representation of the FFRct analysis is provided in a separate PDF document in PACS. This dictation was created using the PDF document and an interactive 3D model of the results. 3D model is not available in the EMR/PACS. Normal FFR range is >0.80.  1. Left Main: 0.99.  2. LAD: Proximal LAD: 0.97, mid 0.72, distal 0.69. 3. D1: 0.91. 4. D2: 0.84. 5. LCX: 0.88. 6. RCA: 0.95.  IMPRESSION: 1. CT FFR analysis showed significant stenosis in the proximal to mid LAD. Cardiac catheterization is recommended once she recovers from urosepsis.   Electronically Signed   By: Katarina  Nelson   On: 11/22/2019 20:25       Recent Labs: 11/17/2019: ALT 46 11/19/2019: BUN 11; Creatinine, Ser 0.96; Hemoglobin 9.3; Platelets 372; Potassium 3.7; Sodium 138   Recent Lipid Panel No results found for: CHOL, TRIG, HDL, CHOLHDL, VLDL, LDLCALC, LDLDIRECT  Physical Exam:    VS:  BP 130/70 (BP Location: Right Arm, Patient Position: Sitting, Cuff Size: Normal)   Pulse 64   Ht 5' 4" (1.626 m)   Wt 149 lb (67.6 kg)   SpO2 96%   BMI 25.58 kg/m     Wt Readings from Last 3 Encounters:  11/28/19 149 lb (67.6 kg)  11/19/19 154 lb (69.9 kg)     GEN: Patient is in no acute distress HEENT: Normal NECK: No JVD; No carotid bruits LYMPHATICS: No lymphadenopathy CARDIAC: Hear sounds regular, 2/6 systolic murmur at the apex. RESPIRATORY:  Clear to auscultation without rales, wheezing or rhonchi  ABDOMEN: Soft, non-tender, non-distended MUSCULOSKELETAL:  No edema; No deformity  SKIN: Warm and dry NEUROLOGIC:  Alert and oriented x 3 PSYCHIATRIC:  Normal affect   Signed, Harvest Deist R Liberta Gimpel, MD  11/28/2019 2:13 PM    [facility]  

## 2019-11-28 NOTE — Patient Instructions (Addendum)
Medication Instructions:  Your physician recommends that you continue on your current medications as directed. Please refer to the Current Medication list given to you today.  *If you need a refill on your cardiac medications before your next appointment, please call your pharmacy*  Lab Work: Your physician recommends that you have a BMP, CBC, TSH, lipid and hepatic drawn today  If you have labs (blood work) drawn today and your tests are completely normal, you will receive your results only by: Marland Kitchen MyChart Message (if you have MyChart) OR . A paper copy in the mail If you have any lab test that is abnormal or we need to change your treatment, we will call you to review the results.  Testing/Procedures: You had an EKG performed today  Your physician has requested that you have a cardiac catheterization. Cardiac catheterization is used to diagnose and/or treat various heart conditions. Doctors may recommend this procedure for a number of different reasons. The most common reason is to evaluate chest pain. Chest pain can be a symptom of coronary artery disease (CAD), and cardiac catheterization can show whether plaque is narrowing or blocking your heart's arteries. This procedure is also used to evaluate the valves, as well as measure the blood flow and oxygen levels in different parts of your heart. For further information please visit [email or web address]. Please follow instruction sheet, as given.  YOU ARE scheduled for cvd19 screening on tomorrow 11/29/19 at 2:30 PM at [place], [place] need to get in the prescreening line.     [place] [address] Dept: [phone] Loc: [phone]  Salinda Snedeker  11/28/2019  You are scheduled for a Cardiac Catheterization on Friday, January 15 with Dr. Larae Grooms.  1. Please arrive at the [facility] (Main  Entrance A) at [facility]: [address] [place], [place] [address] at 5:30 AM (This time is two hours before your procedure to ensure your preparation). Free valet parking service is available.   Special note: Every effort is made to have your procedure done on time. Please understand that emergencies sometimes delay scheduled procedures.  2. Diet: Do not eat solid foods after midnight.  The patient may have clear liquids until 5am upon the day of the procedure.  3. Labs: NONE  4. Medication instructions in preparation for your procedure:   Contrast Allergy: No   Stop taking, Enalapril/Enalaprilat (Vasotec) Thursday, January 14,   Do not take Diabetes Med Glucophage (Metformin) and Januvia on the day of the procedure and HOLD 48 HOURS AFTER THE PROCEDURE.  On the morning of your procedure, take your Aspirin and any morning medicines NOT listed above.  You may use sips of water.  5. Plan for one night stay--bring personal belongings. 6. Bring a current list of your medications and current insurance cards. 7. You MUST have a responsible person to drive you home. 8. Someone MUST be with you the first 24 hours after you arrive home or your discharge will be delayed. 9. Please wear clothes that are easy to get on and off and wear slip-on shoes.  Thank you for allowing [place] to care for you!   -- [facility]  Follow-Up: At [facility], you and your health needs are our priority.  As part of our continuing mission to provide you with exceptional heart care, we have created designated Provider Care Teams.  These Care  Teams include your primary Cardiologist (physician) and Advanced Practice Providers (APPs -  Physician Assistants and Nurse Practitioners) who all work together to provide you with the care you need, when you need it.  Your next appointment:   1 month(s)  The format for your next appointment:   In Person  Provider:   Belva Crome, MD  Other Instructions  Coronary Angiogram With Stent Coronary angiogram with stent placement is a procedure to widen or open a narrow blood vessel of the heart (coronary artery). Arteries may become blocked by cholesterol buildup (plaques) in the lining of the artery wall. When a coronary artery becomes partially blocked, blood flow to that area decreases. This may lead to chest pain or a heart attack (myocardial infarction). A stent is a small piece of metal that looks like mesh or spring. Stent placement may be done as treatment after a heart attack, or to prevent a heart attack if a blocked artery is found by a coronary angiogram. Let your health care provider know about:  Any allergies you have, including allergies to medicines or contrast dye.  All medicines you are taking, including vitamins, herbs, eye drops, creams, and over-the-counter medicines.  Any problems you or family members have had with anesthetic medicines.  Any blood disorders you have.  Any surgeries you have had.  Any medical conditions you have, including kidney problems or kidney failure.  Whether you are pregnant or may be pregnant.  Whether you are breastfeeding. What are the risks? Generally, this is a safe procedure. However, serious problems may occur, including:  Damage to nearby structures or organs, such as the heart, blood vessels, or kidneys.  A return of blockage.  Bleeding, infection, or bruising at the insertion site.  A collection of blood under the skin (hematoma) at the insertion site.  A blood clot in another part of the body.  Allergic reaction to medicines or dyes.  Bleeding into the abdomen (retroperitoneal bleeding).  Stroke (rare).  Heart attack (rare). What happens before the procedure? Staying hydrated Follow instructions from your health care provider about hydration, which may include:  Up to 2 hours before the procedure - you may continue to drink clear  liquids, such as water, clear fruit juice, black coffee, and plain tea.  Eating and drinking restrictions Follow instructions from your health care provider about eating and drinking, which may include:  8 hours before the procedure - stop eating heavy meals or foods, such as meat, fried foods, or fatty foods.  6 hours before the procedure - stop eating light meals or foods, such as toast or cereal.  2 hours before the procedure - stop drinking clear liquids. Medicines Ask your health care provider about:  Changing or stopping your regular medicines. This is especially important if you are taking diabetes medicines or blood thinners.  Taking medicines such as aspirin and ibuprofen. These medicines can thin your blood. Do not take these medicines unless your health care provider tells you to take them. ? Generally, aspirin is recommended before a thin tube, called a catheter, is passed through a blood vessel and inserted into the heart (cardiac catheterization).  Taking over-the-counter medicines, vitamins, herbs, and supplements. General instructions  Do not use any products that contain nicotine or tobacco for at least 4 weeks before the procedure. These products include cigarettes, e-cigarettes, and chewing tobacco. If you need help quitting, ask your health care provider.  Plan to have someone take you home from the hospital or  clinic.  If you will be going home right after the procedure, plan to have someone with you for 24 hours.  You may have tests and imaging procedures.  Ask your health care provider: ? How your insertion site will be marked. Ask which artery will be used for the procedure. ? What steps will be taken to help prevent infection. These may include:  Removing hair at the insertion site.  Washing skin with a germ-killing soap.  Taking antibiotic medicine. What happens during the procedure?   An IV will be inserted into one of your veins.  Electrodes may  be placed on your chest to monitor your heart rate during the procedure.  You will be given one or more of the following: ? A medicine to help you relax (sedative). ? A medicine to numb the area (local anesthetic) for catheter insertion.  A small incision will be made for catheter insertion.  The catheter will be inserted into an artery using a guide wire. The location may be in your groin, your wrist, or the fold of your arm (near your elbow).  An X-ray procedure (fluoroscopy) will be used to help guide the catheter to the opening of the heart arteries.  A dye will be injected into the catheter. X-rays will be taken. The dye helps to show where any narrowing or blockages are located in the arteries.  Tell your health care provider if you have chest pain or trouble breathing.  A tiny wire will be guided to the blocked spot, and a balloon will be inflated to make the artery wider.  The stent will be expanded to crush the plaques into the wall of the vessel. The stent will hold the area open and improve the blood flow. Most stents have a drug coating to reduce the risk of the stent narrowing over time.  The artery may be made wider using a drill, laser, or other tools that remove plaques.  The catheter will be removed when the blood flow improves. The stent will stay where it was placed, and the lining of the artery will grow over it.  A bandage (dressing) will be placed on the insertion site. Pressure will be applied to stop bleeding.  The IV will be removed. This procedure may vary among health care providers and hospitals. What happens after the procedure?  Your blood pressure, heart rate, breathing rate, and blood oxygen level will be monitored until you leave the hospital or clinic.  If the procedure is done through the leg, you will lie flat in bed for a few hours or for as long as told by your health care provider. You will be instructed not to bend or cross your legs.  The  insertion site and the pulse in your foot or wrist will be checked often.  You may have more blood tests, X-rays, and a test that records the electrical activity of your heart (electrocardiogram, or ECG).  Do not drive for 24 hours if you were given a sedative during your procedure. Summary  Coronary angiogram with stent placement is a procedure to widen or open a narrowed coronary artery. This is done to treat heart problems.  Before the procedure, let your health care provider know about all the medical conditions and surgeries you have or have had.  This is a safe procedure. However, some problems may occur, including damage to nearby structures or organs, bleeding, blood clots, or allergies.  Follow your health care provider's instructions about eating, drinking,  medicines, and other lifestyle changes, such as quitting tobacco use before the procedure. This information is not intended to replace advice given to you by your health care provider. Make sure you discuss any questions you have with your health care provider. Document Revised: 05/25/2019 Document Reviewed: 05/25/2019 Elsevier Patient Education  2020 [employer].

## 2019-11-28 NOTE — H&P (View-Only) (Signed)
Cardiology Office Note:    Date:  11/28/2019   ID:  Claudia Christian, DOB Oct 10, 1946, MRN [id number]  PCP:  Claudia Malta, MD  Cardiologist:  Claudia Brothers, MD   Referring MD: No ref. provider found    ASSESSMENT:    1. Type 2 diabetes mellitus with hyperlipidemia (HCC)   2. Coronary arteriosclerosis   3. NSTEMI (non-ST elevated myocardial infarction) (HCC)   4. Hydronephrosis with renal and ureteral calculus obstruction   5. Anemia, unspecified type    PLAN:    In order of problems listed above:  1. 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  Her findings are worrisome.  She has been recommended for coronary angiography at hospital discharge but the doctors have been reluctant to do this because of urosepsis which appears to have cleared now.I discussed coronary angiography and left heart catheterization with the patient at extensive length. Procedure, benefits and potential risks were explained. Patient had multiple questions which were answered to the patient's satisfaction. Patient agreed and consented for the procedure. Further recommendations will be made based on the findings of the coronary angiography. In the interim. The patient has any significant symptoms he knows to go to the nearest emergency room. 2. We will do blood work today including Chem-7 CBC.  She is anemic and I would like to know that status.  She denies any blood loss from her stools or any such issues. 3. I had an extensive discussion with her urologist Dr. Berneice Christian and he mentioned to me that it will be months before they plan any invasive surgery for her nephrolithiasis issue.  I think in the interim it would be prudent to attend to coronary artery disease and she may be a candidate for something like a bare-metal stent if surgery is to be planned in the next couple of  months.  I will let my interventional colleagues take a call on this issue.  I discussed the entire issue with her at extensive length and questions were answered to her satisfaction.  Total time for this evaluation was 45 minutes.   Medication Adjustments/Labs and Tests Ordered: Current medicines are reviewed at length with the patient today.  Concerns regarding medicines are outlined above.  No orders of the defined types were placed in this encounter.  No orders of the defined types were placed in this encounter.    Chief Complaint  Patient presents with  . Transitions Of Care    At [facility] and then to [facility]     History of Present Illness:    Claudia Christian is a 74 y.o. female.  Patient was in the [facility] and transferred to [facility].  She has history of essential hypertension  and diabetes mellitus.  The patient had a non-STEMI.  Subsequently she has been diagnosed to have hydronephrosis and has a catheter drain.  She is planning to undergo surgery at the end of the month for this according to the patient.  Her CT FFR revealed significant proximal to mid LAD stenosis and the details are mentioned below.  Is very concerning.  She is here for follow-up.  Is been about a week since since hospital discharge.  At the time of my evaluation, the patient is alert awake oriented and in no distress.  Past Medical History:  Diagnosis Date  . Diabetes mellitus type 2, controlled (HCC)   .  Essential hypertension   . Hyperlipidemia   . Nephrolithiasis     Past Surgical History:  Procedure Laterality Date  . IR NEPHROSTOMY PLACEMENT LEFT  11/16/2019  . LITHOTRIPSY    . TUBAL LIGATION  1984    Current Medications: Current Meds  Medication Sig  . acetaminophen (TYLENOL) 325 MG tablet Take 2 tablets (650 mg total) by mouth every 6 (six) hours as needed for mild pain (or Fever >/= 101).  Marland Kitchen alendronate (FOSAMAX) 70 MG tablet Take 70 mg by mouth once a week. Take with a full  glass of water on an empty stomach.  Marland Kitchen aspirin EC 81 MG EC tablet Take 1 tablet (81 mg total) by mouth daily.  Marland Kitchen atorvastatin (LIPITOR) 40 MG tablet Take 1 tablet (40 mg total) by mouth daily.  . enalapril (VASOTEC) 20 MG tablet Take 20 mg by mouth daily.  . metFORMIN (GLUCOPHAGE) 1000 MG tablet Take 1,000 mg by mouth 2 (two) times daily with a meal.  . metoprolol tartrate (LOPRESSOR) 25 MG tablet Take 1 tablet (25 mg total) by mouth 2 (two) times daily. (Patient taking differently: Take 25 mg by mouth daily. )  . polyethylene glycol (MIRALAX / GLYCOLAX) 17 g packet Take 17 g by mouth daily as needed for mild constipation.  . sertraline (ZOLOFT) 50 MG tablet Take 50 mg by mouth daily.  . sitaGLIPtin (JANUVIA) 25 MG tablet Take 25 mg by mouth daily.  . vitamin B-12 (CYANOCOBALAMIN) 1000 MCG tablet Take 1,000 mcg by mouth daily.     Allergies:   Patient has no known allergies.   Social History   Socioeconomic History  . Marital status: Married    Spouse name: Not on file  . Number of children: Not on file  . Years of education: Not on file  . Highest education level: Not on file  Occupational History  . Not on file  Tobacco Use  . Smoking status: Never Smoker  . Smokeless tobacco: Never Used  Substance and Sexual Activity  . Alcohol use: Never  . Drug use: Never  . Sexual activity: Yes    Partners: Male  Other Topics Concern  . Not on file  Social History Narrative  . Not on file   Social Determinants of Health   Financial Resource Strain:   . Difficulty of Paying Living Expenses: Not on file  Food Insecurity:   . Worried About Charity fundraiser in the Last Year: Not on file  . Ran Out of Food in the Last Year: Not on file  Transportation Needs:   . Lack of Transportation (Medical): Not on file  . Lack of Transportation (Non-Medical): Not on file  Physical Activity:   . Days of Exercise per Week: Not on file  . Minutes of Exercise per Session: Not on file  Stress:     . Feeling of Stress : Not on file  Social Connections:   . Frequency of Communication with Friends and Family: Not on file  . Frequency of Social Gatherings with Friends and Family: Not on file  . Attends Religious Services: Not on file  . Active Member of Clubs or Organizations: Not on file  . Attends Archivist Meetings: Not on file  . Marital Status: Not on file     Family History: The patient's family history includes Heart attack in her brother, father, and mother.  ROS:   Please see the history of present illness.    All other systems reviewed  and are negative.  EKGs/Labs/Other Studies Reviewed:    The following studies were reviewed today: CT CORONARY FRACTIONAL FLOW RESERVE DATA PREP (Accession [id number]) (Order [id number]) Imaging Date: 11/20/2019 Department: [facility] Released By: Scherry Ran, RT (auto-released) Authorizing: Lars Masson, MD  Exam Status  Status  Final [99]  Study Result  EXAM: CT FFR ANALYSIS  CLINICAL DATA:  74 year old female with abnormal CCTA.  FINDINGS: FFRct analysis was performed on the original cardiac CT angiogram dataset. Diagrammatic representation of the FFRct analysis is provided in a separate PDF document in PACS. This dictation was created using the PDF document and an interactive 3D model of the results. 3D model is not available in the EMR/PACS. Normal FFR range is >0.80.  1. Left Main: 0.99.  2. LAD: Proximal LAD: 0.97, mid 0.72, distal 0.69. 3. D1: 0.91. 4. D2: 0.84. 5. LCX: 0.88. 6. RCA: 0.95.  IMPRESSION: 1. CT FFR analysis showed significant stenosis in the proximal to mid LAD. Cardiac catheterization is recommended once she recovers from urosepsis.   Electronically Signed   By: Tobias Alexander   On: 11/22/2019 20:25       Recent Labs: 11/17/2019: ALT 46 11/19/2019: BUN 11; Creatinine, Ser 0.96; Hemoglobin 9.3; Platelets 372; Potassium 3.7; Sodium 138   Recent Lipid Panel No results found for: CHOL, TRIG, HDL, CHOLHDL, VLDL, LDLCALC, LDLDIRECT  Physical Exam:    VS:  BP 130/70 (BP Location: Right Arm, Patient Position: Sitting, Cuff Size: Normal)   Pulse 64   Ht 5\' 4"  (1.626 m)   Wt 149 lb (67.6 kg)   SpO2 96%   BMI 25.58 kg/m     Wt Readings from Last 3 Encounters:  11/28/19 149 lb (67.6 kg)  11/19/19 154 lb (69.9 kg)     GEN: Patient is in no acute distress HEENT: Normal NECK: No JVD; No carotid bruits LYMPHATICS: No lymphadenopathy CARDIAC: Hear sounds regular, 2/6 systolic murmur at the apex. RESPIRATORY:  Clear to auscultation without rales, wheezing or rhonchi  ABDOMEN: Soft, non-tender, non-distended MUSCULOSKELETAL:  No edema; No deformity  SKIN: Warm and dry NEUROLOGIC:  Alert and oriented x 3 PSYCHIATRIC:  Normal affect   Signed, 01/17/20, MD  11/28/2019 2:13 PM    [facility]

## 2019-11-29 ENCOUNTER — Other Ambulatory Visit (HOSPITAL_COMMUNITY)
Admission: RE | Admit: 2019-11-29 | Discharge: 2019-11-29 | Disposition: A | Discharge status: Home / Self Care | Payer: Medicare PPO | Source: Ambulatory Visit | Attending: Interventional Cardiology | Admitting: Interventional Cardiology

## 2019-11-29 ENCOUNTER — Encounter: Payer: Self-pay | Admitting: *Deleted

## 2019-11-29 DIAGNOSIS — N132 Hydronephrosis with renal and ureteral calculous obstruction: Secondary | ICD-10-CM | POA: Diagnosis not present

## 2019-11-29 DIAGNOSIS — I214 Non-ST elevation (NSTEMI) myocardial infarction: Secondary | ICD-10-CM | POA: Diagnosis not present

## 2019-11-29 DIAGNOSIS — E119 Type 2 diabetes mellitus without complications: Secondary | ICD-10-CM | POA: Diagnosis not present

## 2019-11-29 DIAGNOSIS — Z01812 Encounter for preprocedural laboratory examination: Secondary | ICD-10-CM | POA: Diagnosis not present

## 2019-11-29 DIAGNOSIS — N131 Hydronephrosis with ureteral stricture, not elsewhere classified: Secondary | ICD-10-CM | POA: Diagnosis not present

## 2019-11-29 DIAGNOSIS — I10 Essential (primary) hypertension: Secondary | ICD-10-CM | POA: Diagnosis not present

## 2019-11-29 DIAGNOSIS — Z20822 Contact with and (suspected) exposure to covid-19: Secondary | ICD-10-CM | POA: Diagnosis not present

## 2019-11-29 DIAGNOSIS — A419 Sepsis, unspecified organism: Secondary | ICD-10-CM | POA: Diagnosis not present

## 2019-11-29 DIAGNOSIS — Z7984 Long term (current) use of oral hypoglycemic drugs: Secondary | ICD-10-CM | POA: Diagnosis not present

## 2019-11-29 DIAGNOSIS — Z7982 Long term (current) use of aspirin: Secondary | ICD-10-CM | POA: Diagnosis not present

## 2019-11-29 LAB — BASIC METABOLIC PANEL
BUN/Creatinine Ratio: 19 (ref 12–28)
BUN: 16 mg/dL (ref 8–27)
CO2: 23 mmol/L (ref 20–29)
Calcium: 10.1 mg/dL (ref 8.7–10.3)
Chloride: 99 mmol/L (ref 96–106)
Creatinine, Ser: 0.85 mg/dL (ref 0.57–1.00)
GFR calc Af Amer: 80 mL/min/{1.73_m2} (ref >59)
GFR calc non Af Amer: 69 mL/min/{1.73_m2} (ref >59)
Glucose: 119 mg/dL — ABNORMAL HIGH (ref 65–99)
Potassium: 4.9 mmol/L (ref 3.5–5.2)
Sodium: 138 mmol/L (ref 134–144)

## 2019-11-29 LAB — CBC
Hematocrit: 34.2 % (ref 34.0–46.6)
Hemoglobin: 11.1 g/dL (ref 11.1–15.9)
MCH: 28.8 pg (ref 26.6–33.0)
MCHC: 32.5 g/dL (ref 31.5–35.7)
MCV: 89 fL (ref 79–97)
Platelets: 424 10*3/uL (ref 150–450)
RBC: 3.85 x10E6/uL (ref 3.77–5.28)
RDW: 12.9 % (ref 11.7–15.4)
WBC: 10.7 10*3/uL (ref 3.4–10.8)

## 2019-11-29 LAB — HEPATIC FUNCTION PANEL
ALT: 11 IU/L (ref 0–32)
AST: 15 IU/L (ref 0–40)
Albumin: 3.7 g/dL (ref 3.7–4.7)
Alkaline Phosphatase: 81 IU/L (ref 39–117)
Bilirubin Total: 0.2 mg/dL (ref 0.0–1.2)
Bilirubin, Direct: 0.07 mg/dL (ref 0.00–0.40)
Total Protein: 7.6 g/dL (ref 6.0–8.5)

## 2019-11-29 LAB — LIPID PANEL
Chol/HDL Ratio: 3.1 ratio (ref 0.0–4.4)
Cholesterol, Total: 113 mg/dL (ref 100–199)
HDL: 36 mg/dL — ABNORMAL LOW (ref >39)
LDL Chol Calc (NIH): 44 mg/dL (ref 0–99)
Triglycerides: 208 mg/dL — ABNORMAL HIGH (ref 0–149)
VLDL Cholesterol Cal: 33 mg/dL (ref 5–40)

## 2019-11-29 LAB — TSH: TSH: 0.497 u[IU]/mL (ref 0.450–4.500)

## 2019-12-01 ENCOUNTER — Telehealth: Payer: Self-pay | Admitting: *Deleted

## 2019-12-01 LAB — NOVEL CORONAVIRUS, NAA (HOSP ORDER, SEND-OUT TO REF LAB; TAT 18-24 HRS): SARS-CoV-2, NAA: NOT DETECTED

## 2019-12-01 NOTE — Telephone Encounter (Signed)
Patient setup for cath

## 2019-12-01 NOTE — Telephone Encounter (Signed)
Pt contacted pre-catheterization scheduled at [facility] for: Friday December 02, 2019 7:30 AM Verified arrival time and place: [facility] Main Entrance A [facility]) at: 5:30 AM   No solid food after midnight prior to cath, clear liquids until 5 AM day of procedure. Contrast allergy: no  Hold: Metformin-day of procedure and 48 hours post procedure. Januvia-day of procedure.  Except hold medications AM meds can be  taken pre-cath with sip of water including: ASA 81 mg   Confirmed patient has responsible adult to drive home post procedure and observe 24 hours after arriving home: yes  Currently, due to Covid-19 pandemic, only one support person will be allowed with patient. Must be the same support person for that patient's entire stay, will be screened and required to wear a mask. They will be asked to wait in the waiting room for the duration of the patient's stay.  Patients are required to wear a mask when they enter the hospital.     COVID-19 Pre-Screening Questions:  . In the past 7 to 10 days have you had a cough,  shortness of breath, headache, congestion, fever (100 or greater) body aches, chills, sore throat, or sudden loss of taste or sense of smell? no . Have you been around anyone with known Covid 19? no . Have you been around anyone who is awaiting Covid 19 test results in the past 7 to 10 days? no . Have you been around anyone who has been exposed to Covid 19, or has mentioned symptoms of Covid 19 within the past 7 to 10 days? no  I reviewed procedure/mask/visitor instructions, Covid-19 screening questions with patient, she verbalized understanding, thanked me for call.

## 2019-12-02 ENCOUNTER — Encounter (HOSPITAL_COMMUNITY)
Admission: RE | Disposition: A | Discharge status: Home / Self Care | Payer: Medicare PPO | Source: Home / Self Care | Attending: Interventional Cardiology

## 2019-12-02 ENCOUNTER — Ambulatory Visit (HOSPITAL_COMMUNITY)
Admission: RE | Admit: 2019-12-02 | Discharge: 2019-12-02 | Disposition: A | Discharge status: Home / Self Care | Payer: Medicare PPO | Attending: Interventional Cardiology | Admitting: Interventional Cardiology

## 2019-12-02 ENCOUNTER — Telehealth: Payer: Self-pay | Admitting: Physician Assistant

## 2019-12-02 DIAGNOSIS — D649 Anemia, unspecified: Secondary | ICD-10-CM | POA: Insufficient documentation

## 2019-12-02 DIAGNOSIS — I214 Non-ST elevation (NSTEMI) myocardial infarction: Secondary | ICD-10-CM | POA: Diagnosis not present

## 2019-12-02 DIAGNOSIS — E119 Type 2 diabetes mellitus without complications: Secondary | ICD-10-CM | POA: Insufficient documentation

## 2019-12-02 DIAGNOSIS — N132 Hydronephrosis with renal and ureteral calculous obstruction: Secondary | ICD-10-CM

## 2019-12-02 DIAGNOSIS — I2510 Atherosclerotic heart disease of native coronary artery without angina pectoris: Secondary | ICD-10-CM | POA: Diagnosis present

## 2019-12-02 DIAGNOSIS — E785 Hyperlipidemia, unspecified: Secondary | ICD-10-CM | POA: Diagnosis not present

## 2019-12-02 DIAGNOSIS — I10 Essential (primary) hypertension: Secondary | ICD-10-CM

## 2019-12-02 DIAGNOSIS — I25118 Atherosclerotic heart disease of native coronary artery with other forms of angina pectoris: Secondary | ICD-10-CM

## 2019-12-02 DIAGNOSIS — Z7984 Long term (current) use of oral hypoglycemic drugs: Secondary | ICD-10-CM | POA: Diagnosis not present

## 2019-12-02 DIAGNOSIS — Z95820 Peripheral vascular angioplasty status with implants and grafts: Secondary | ICD-10-CM

## 2019-12-02 DIAGNOSIS — Z79899 Other long term (current) drug therapy: Secondary | ICD-10-CM | POA: Insufficient documentation

## 2019-12-02 DIAGNOSIS — Z7902 Long term (current) use of antithrombotics/antiplatelets: Secondary | ICD-10-CM | POA: Insufficient documentation

## 2019-12-02 DIAGNOSIS — Z7982 Long term (current) use of aspirin: Secondary | ICD-10-CM | POA: Insufficient documentation

## 2019-12-02 DIAGNOSIS — E1169 Type 2 diabetes mellitus with other specified complication: Secondary | ICD-10-CM

## 2019-12-02 DIAGNOSIS — Z8249 Family history of ischemic heart disease and other diseases of the circulatory system: Secondary | ICD-10-CM | POA: Diagnosis not present

## 2019-12-02 DIAGNOSIS — I252 Old myocardial infarction: Secondary | ICD-10-CM

## 2019-12-02 HISTORY — PX: INTRAVASCULAR ULTRASOUND/IVUS: CATH118244

## 2019-12-02 HISTORY — PX: CORONARY BALLOON ANGIOPLASTY: CATH118233

## 2019-12-02 HISTORY — PX: LEFT HEART CATH AND CORONARY ANGIOGRAPHY: CATH118249

## 2019-12-02 HISTORY — PX: CORONARY STENT INTERVENTION: CATH118234

## 2019-12-02 LAB — GLUCOSE, CAPILLARY: Glucose-Capillary: 101 mg/dL — ABNORMAL HIGH (ref 70–99)

## 2019-12-02 LAB — POCT ACTIVATED CLOTTING TIME
Activated Clotting Time: 241 seconds
Activated Clotting Time: 296 seconds
Activated Clotting Time: 296 seconds

## 2019-12-02 SURGERY — LEFT HEART CATH AND CORONARY ANGIOGRAPHY
Anesthesia: LOCAL

## 2019-12-02 MED ORDER — TICAGRELOR 90 MG PO TABS
ORAL_TABLET | ORAL | Status: AC
  Filled 2019-12-02: qty 2

## 2019-12-02 MED ORDER — TICAGRELOR 90 MG PO TABS: 90.0000 mg | ORAL_TABLET | Freq: Two times a day (BID) | ORAL | Status: DC

## 2019-12-02 MED ORDER — LIDOCAINE HCL (PF) 1 % IJ SOLN
INTRAMUSCULAR | Status: DC | PRN
  Administered 2019-12-02: 2 mL via INTRADERMAL

## 2019-12-02 MED ORDER — SODIUM CHLORIDE 0.9% FLUSH: 3.0000 mL | Freq: Two times a day (BID) | INTRAVENOUS | Status: DC

## 2019-12-02 MED ORDER — MIDAZOLAM HCL 2 MG/2ML IJ SOLN
INTRAMUSCULAR | Status: DC | PRN
  Administered 2019-12-02: 2 mg via INTRAVENOUS

## 2019-12-02 MED ORDER — VERAPAMIL HCL 2.5 MG/ML IV SOLN
INTRAVENOUS | Status: AC
  Filled 2019-12-02: qty 2

## 2019-12-02 MED ORDER — SODIUM CHLORIDE 0.9% FLUSH: 3.0000 mL | INTRAVENOUS | Status: DC | PRN

## 2019-12-02 MED ORDER — NITROGLYCERIN 1 MG/10 ML FOR IR/CATH LAB
INTRA_ARTERIAL | Status: DC | PRN
  Administered 2019-12-02 (×2): 200 ug via INTRACORONARY
  Administered 2019-12-02: 300 ug via INTRA_ARTERIAL
  Administered 2019-12-02: 200 ug via INTRACORONARY

## 2019-12-02 MED ORDER — FENTANYL CITRATE (PF) 100 MCG/2ML IJ SOLN
INTRAMUSCULAR | Status: DC | PRN
  Administered 2019-12-02: 25 ug via INTRAVENOUS

## 2019-12-02 MED ORDER — HEPARIN SODIUM (PORCINE) 1000 UNIT/ML IJ SOLN
INTRAMUSCULAR | Status: AC
  Filled 2019-12-02: qty 1

## 2019-12-02 MED ORDER — VERAPAMIL HCL 2.5 MG/ML IV SOLN
INTRAVENOUS | Status: DC | PRN
  Administered 2019-12-02: 08:00:00 10 mL via INTRA_ARTERIAL

## 2019-12-02 MED ORDER — TICAGRELOR 90 MG PO TABS: 90.0000 mg | ORAL_TABLET | Freq: Two times a day (BID) | ORAL | 11 refills | Status: DC

## 2019-12-02 MED ORDER — TICAGRELOR 90 MG PO TABS
ORAL_TABLET | ORAL | Status: DC | PRN
  Administered 2019-12-02: 180 mg via ORAL

## 2019-12-02 MED ORDER — SODIUM CHLORIDE 0.9 % IV SOLN: 250.0000 mL | INTRAVENOUS | Status: DC | PRN

## 2019-12-02 MED ORDER — SODIUM CHLORIDE 0.9 % WEIGHT BASED INFUSION: 1.0000 mL/kg/h | INTRAVENOUS | Status: DC

## 2019-12-02 MED ORDER — NITROGLYCERIN 1 MG/10 ML FOR IR/CATH LAB
INTRA_ARTERIAL | Status: AC
  Filled 2019-12-02: qty 10

## 2019-12-02 MED ORDER — METOPROLOL TARTRATE 25 MG PO TABS: 12.5000 mg | ORAL_TABLET | Freq: Two times a day (BID) | ORAL | 3 refills | Status: DC

## 2019-12-02 MED ORDER — HEPARIN (PORCINE) IN NACL 1000-0.9 UT/500ML-% IV SOLN
INTRAVENOUS | Status: DC | PRN
  Administered 2019-12-02 (×2): 500 mL

## 2019-12-02 MED ORDER — NITROGLYCERIN 0.4 MG SL SUBL: 0.4000 mg | SUBLINGUAL_TABLET | SUBLINGUAL | 3 refills | Status: DC | PRN

## 2019-12-02 MED ORDER — IOHEXOL 350 MG/ML SOLN
INTRAVENOUS | Status: DC | PRN
  Administered 2019-12-02: 09:00:00 125 mL via INTRACARDIAC

## 2019-12-02 MED ORDER — ASPIRIN 81 MG PO CHEW: 81.0000 mg | CHEWABLE_TABLET | ORAL | Status: DC

## 2019-12-02 MED ORDER — FENTANYL CITRATE (PF) 100 MCG/2ML IJ SOLN
INTRAMUSCULAR | Status: AC
  Filled 2019-12-02: qty 2

## 2019-12-02 MED ORDER — LIDOCAINE HCL (PF) 1 % IJ SOLN
INTRAMUSCULAR | Status: AC
  Filled 2019-12-02: qty 30

## 2019-12-02 MED ORDER — SODIUM CHLORIDE 0.9 % IV SOLN: INTRAVENOUS | Status: AC

## 2019-12-02 MED ORDER — MIDAZOLAM HCL 2 MG/2ML IJ SOLN
INTRAMUSCULAR | Status: AC
  Filled 2019-12-02: qty 2

## 2019-12-02 MED ORDER — SODIUM CHLORIDE 0.9 % WEIGHT BASED INFUSION
3.0000 mL/kg/h | INTRAVENOUS | Status: AC
  Administered 2019-12-02: 06:00:00 3 mL/kg/h via INTRAVENOUS

## 2019-12-02 MED ORDER — HEPARIN SODIUM (PORCINE) 1000 UNIT/ML IJ SOLN
INTRAMUSCULAR | Status: DC | PRN
  Administered 2019-12-02: 5000 [IU] via INTRAVENOUS
  Administered 2019-12-02: 4500 [IU] via INTRAVENOUS
  Administered 2019-12-02: 3500 [IU] via INTRAVENOUS
  Administered 2019-12-02: 3000 [IU] via INTRAVENOUS
  Administered 2019-12-02: 2000 [IU] via INTRAVENOUS

## 2019-12-02 MED ORDER — HEPARIN (PORCINE) IN NACL 1000-0.9 UT/500ML-% IV SOLN
INTRAVENOUS | Status: AC
  Filled 2019-12-02: qty 1000

## 2019-12-02 MED FILL — NITROGLYCERIN 0.4 MG TAB SL: 0.4 | 8 days supply | Qty: 25 | Fill #0

## 2019-12-02 MED FILL — BRILINTA 90 MG TABLET: 90 | 30 days supply | Qty: 60 | Fill #0

## 2019-12-02 SURGICAL SUPPLY — 24 items
BALLN SAPPHIRE 2.25X15 (BALLOONS) ×4
BALLN SAPPHIRE ~~LOC~~ 3.0X15 (BALLOONS) ×1 IMPLANT
BALLOON SAPPHIRE 2.25X15 (BALLOONS) IMPLANT
CATH 5FR JL3.5 JR4 ANG PIG MP (CATHETERS) ×1 IMPLANT
CATH LAUNCHER 6FR EBU3.5 (CATHETERS) ×1 IMPLANT
CATH OPTICROSS 40MHZ (CATHETERS) ×1 IMPLANT
DEVICE RAD COMP TR BAND LRG (VASCULAR PRODUCTS) ×1 IMPLANT
GLIDESHEATH SLEND SS 6F .021 (SHEATH) ×1 IMPLANT
GUIDEWIRE INQWIRE 1.5J.035X260 (WIRE) IMPLANT
INQWIRE 1.5J .035X260CM (WIRE) ×2
KIT ENCORE 26 ADVANTAGE (KITS) ×2 IMPLANT
KIT HEART LEFT (KITS) ×2 IMPLANT
KIT HEMO VALVE WATCHDOG (MISCELLANEOUS) ×1 IMPLANT
PACK CARDIAC CATHETERIZATION (CUSTOM PROCEDURE TRAY) ×2 IMPLANT
SLED PULL BACK IVUS (MISCELLANEOUS) ×1 IMPLANT
STENT SYNERGY XD 2.50X24 (Permanent Stent) IMPLANT
STENT SYNERGY XD 2.75X8 (Permanent Stent) IMPLANT
SYNERGY XD 2.50X24 (Permanent Stent) ×2 IMPLANT
SYNERGY XD 2.75X8 (Permanent Stent) ×2 IMPLANT
TRANSDUCER W/STOPCOCK (MISCELLANEOUS) ×2 IMPLANT
TUBING CIL FLEX 10 FLL-RA (TUBING) ×2 IMPLANT
WIRE ASAHI PROWATER 180CM (WIRE) ×2 IMPLANT
WIRE HI TORQ BMW 190CM (WIRE) ×1 IMPLANT
WIRE HI TORQ VERSACORE-J 145CM (WIRE) ×1 IMPLANT

## 2019-12-02 NOTE — Discharge Summary (Addendum)
Discharge Summary for Same Day PCI   Patient ID: Thomasene Dubow MRN: [id number]; DOB: [birth year]  Admit date: 12/02/2019 Discharge date: 12/02/2019  Primary Care Provider: Buckner Malta, MD  Primary Cardiologist: Garwin Brothers, MD  Primary Electrophysiologist:  None   Discharge Diagnoses    Principal Problem:   CAD (coronary artery disease) Active Problems:   History of non-ST elevation myocardial infarction (NSTEMI)   Type 2 diabetes mellitus with hyperlipidemia [facility])   Essential hypertension  Diagnostic Studies/Procedures    Cardiac Catheterization 12/02/2019: Conclusion    Mid LAD-2 lesion is 80% stenosed.  A drug-eluting stent was successfully placed using a SYNERGY XD 2.50X24.  Post intervention, there is a 0% residual stenosis.  Mid LAD-1 lesion is 70% stenosed.  A drug-eluting stent was successfully placed using a SYNERGY XD 2.75X8.  Post intervention, there is a 0% residual stenosis.  2nd Diag lesion is 75% stenosed.  Balloon angioplasty was performed using a BALLOON SAPPHIRE 2.25X15.  Post intervention, there is a 25% residual stenosis.  The left ventricular systolic function is normal.  LV end diastolic pressure is normal.  The left ventricular ejection fraction is 55-65% by visual estimate.  There is no aortic valve stenosis.   Complex, mid LAD bifurcation lesion.  Size differential from proximal to mid LAD due to large diagonals originating from LAD.  Difficult to lay out mid LAD.  IVUS was helpful to show actual vessel size and guide postdilatation    Dominance: Right Left Anterior Descending  Mid LAD-1 lesion 70% stenosed  Mid LAD-1 lesion is 70% stenosed.  Mid LAD-2 lesion 80% stenosed  Mid LAD-2 lesion is 80% stenosed.  Second Diagonal Branch  2nd Diag lesion 75% stenosed  2nd Diag lesion is 75% stenosed.  Left Circumflex  Vessel is small.  Ost Cx lesion 25% stenosed  Ost Cx lesion is 25% stenosed.  Right Coronary Artery    Vessel is small.   Intervention  Mid LAD-1 lesion  Stent  CATHETER LAUNCHER 6FR EBU3.5 guide catheter was inserted. Lesion crossed with guidewire using a WIRE ASAHI PROWATER 180CM. A drug-eluting stent was successfully placed using a SYNERGY XD 2.75X8. Stent strut is well apposed. Stent overlaps previously placed stent. Post-stent angioplasty was performed using a BALLOON SAPPHIRE [place] 3.0X15.  Post-Intervention Lesion Assessment  The intervention was successful. Pre-interventional TIMI flow is 3. Post-intervention TIMI flow is 3. No complications occurred at this lesion.  There is a 0% residual stenosis post intervention.  Mid LAD-2 lesion  Stent  CATHETER LAUNCHER 6FR EBU3.5 guide catheter was inserted. Lesion crossed with guidewire using a WIRE ASAHI PROWATER 180CM. Pre-stent angioplasty was performed using a BALLOON SAPPHIRE 2.25X15. A drug-eluting stent was successfully placed using a SYNERGY XD 2.50X24. Stent strut is well apposed. Post-stent angioplasty was performed. It was difficult to see the disease in the larger LAD proximal to second diagonal. The more proximal disease and edge dissection was stented. THe diagonal was wired through the stent struts. IVUS was then performed, showing that the stent was undersized, and showing moderate diffuse disease. Then, the entire stented segment was postdilated with a 3.0 balloon to high pressure. Final kissing balloons were done with a 3.0 in the LAD and a 2.25 in the diagonal.  Post-Intervention Lesion Assessment  The intervention was successful. Pre-interventional TIMI flow is 3. Post-intervention TIMI flow is 3. No complications occurred at this lesion.  There is a 0% residual stenosis post intervention.  2nd Diag lesion  Angioplasty  CATHETER LAUNCHER 6FR EBU3.5  guide catheter was inserted. WIRE HI TORQ BMW 190CM guidewire used to cross lesion. Balloon angioplasty was performed using a BALLOON SAPPHIRE 2.25X15. Final simultaneous kissing  balloon inflation performed in the diagonal and LAD. It did appear that the balloon burst at the end of the inflation.  Post-Intervention Lesion Assessment  The intervention was successful. Pre-interventional TIMI flow is 3. Post-intervention TIMI flow is 3. No complications occurred at this lesion.  There is a 25% residual stenosis post intervention.   _____________   History of Present Illness     Claudia Christian is a 74 y.o. female with CAD by coronary CT, DM, HTN, hyperlipidemia, nephrolithiasis with hydronephrosis, anemia who presented to [facility] for planned cath. She was recently admitted 12/29-11/19/19 with fever and sepsis. She was found to have moderate to severe left-sided hydro and ureteral nephrosis secondary to obstructing 1 x 0.6 cm stone at the proximal left ureter with additional nonobstructing stones seen in the lower pole of the left kidney measuring up to 1.7 cm. Neph tube was placed. During admission she was also noted to have a NSTEMI (2.02 at OSH). Coronary CTA was abnormal but initial cath deferred to allow time to recover from urologic issues. She saw Dr. Geraldo Pitter back in the office who had reviewed case with urologist Dr. Tresa Moore who indicated it would be months before they planned any invasive surgery for nephrolithasis issue. It was felt that coronary status should be assessed. Cardiac catheterization was arranged for further evaluation.  Hospital Course     The patient underwent cardiac cath as noted above showing complex, mid LAD bifurcation lesion. She underwent PTCA and DES placement x 2 to LAD and angioplasty to 2nd diagonal lesion.  Plan for DAPT with ASA/Brilinta for at least 12 months (ACS-Class 1 recommendation). The patient was seen by cardiac rehab while in short stay. Post-cath after ambulating to the bathroom she was noted to develop ecchymosis and mild swelling of the forearm. This was treated with compression.  Radial cath site was re-evaluated prior to discharge  and found to be stable without any new complications. Swelling has improved. Jeannelle Wiens was seen by Dr. Irish Lack and determined stable for discharge home. Instructions/precautions regarding cath site care were given prior to discharge. Post-cath she was noted to have a mild pink tinge to the urine in her nephrostomy bag. This was discussed with Dr. Irish Lack who felt it was likely related to the heparin and additional blood thinner loading from the cath. She was advised to call her urologist tomorrow as well as our office if it should continue to make them aware. Nurse had already printed AVS so I asked her to write this on the AVS as well. Dr. Irish Lack did say that she patient would be a candidate to stop her aspirin in 1 week if needed - this can be addressed at her follow-up appointment with Dr. Geraldo Pitter.  Follow up with our office has been arranged. Medications are listed below. Pertinent changes include addition of Brilinta and SL NTG. She was previously prescribed metoprolol 25mg  BID but only taking 1 tab daily at home due to lower blood pressure. Per d/w MD, will have her take 1/2 tablet BID instead since this is a BID medicine. She was also educated on avoidance of NSAIDS given DAPT.  _____________  Cath/PCI Registry Performance & Quality Measures: 1. Aspirin prescribed? - Yes 2. ADP Receptor Inhibitor (Plavix/Clopidogrel, Brilinta/Ticagrelor or Effient/Prasugrel) prescribed (includes medically managed patients)? - Yes 3. High Intensity Statin (Lipitor 40-80mg  or  Crestor 20-40mg ) prescribed? - Yes 4. For EF <40%, was ACEI/ARB prescribed? - Not Applicable (EF >/= 40%) 5. For EF <40%, Aldosterone Antagonist (Spironolactone or Eplerenone) prescribed? - Not Applicable (EF >/= 40%) 6. Cardiac Rehab Phase II ordered (Included Medically managed Patients)? - Yes  _____________   Discharge Vitals Blood pressure (!) 149/73, pulse 70, temperature 98.1 F (36.7 C), temperature source Oral, resp.  rate 16, height  (1.626 m), weight 67.6 kg, SpO2 99 %.  Filed Weights   12/02/19 0535  Weight: 67.6 kg    Last Labs & Radiologic Studies    _____________  CARDIAC CATHETERIZATION  Result Date: 12/02/2019  Mid LAD-2 lesion is 80% stenosed.  A drug-eluting stent was successfully placed using a SYNERGY XD 2.50X24.  Post intervention, there is a 0% residual stenosis.  Mid LAD-1 lesion is 70% stenosed.  A drug-eluting stent was successfully placed using a SYNERGY XD 2.75X8.  Post intervention, there is a 0% residual stenosis.  2nd Diag lesion is 75% stenosed.  Balloon angioplasty was performed using a BALLOON SAPPHIRE 2.25X15.  Post intervention, there is a 25% residual stenosis.  The left ventricular systolic function is normal.  LV end diastolic pressure is normal.  The left ventricular ejection fraction is 55-65% by visual estimate.  There is no aortic valve stenosis.  Complex, mid LAD bifurcation lesion.  Size differential from proximal to mid LAD due to large diagonals originating from LAD.  Difficult to lay out mid LAD.  IVUS was helpful to show actual vessel size and guide postdilatation.   CT CARDIAC MORPH/PULM VEIN W/CM&W/O CA SCORE  Addendum Date: 11/19/2019   ADDENDUM REPORT: 11/19/2019 16:34 CLINICAL DATA:  74 year old female with elevated troponin in the settings of urosepsis. EXAM: Cardiac/Coronary  CTA TECHNIQUE: The patient was scanned on a [employer]. FINDINGS: A 100 kV prospective scan was triggered in the descending thoracic aorta at 111 HU's. Axial non-contrast 3 mm slices were carried out through the heart. The data set was analyzed on a dedicated work station and scored using the Agatson method. Gantry rotation speed was 250 msecs and collimation was .6 mm. 50 mg of PO Metoprolol and 0.8 mg of sl NTG was given. The 3D data set was reconstructed in 5% intervals of the 67-82 % of the R-R cycle. Diastolic phases were analyzed on a dedicated work station using  MPR, MIP and VRT modes. The patient received 80 cc of contrast. Aorta:  Normal size.  No calcifications.  No dissection. Aortic Valve:  Trileaflet.  No calcifications. Coronary Arteries:  Normal coronary origin.  Right dominance. RCA is a small lumen dominant artery that gives rise to PDA and PLA and has minimal plaque. Left main is a large artery that gives rise to LAD and LCX arteries. Left main has minimal plaque. LAD is a large vessel that gives rise to two diagonal arteries. Proximal LAD has moderate diffuse predominantly calcified plaque with stenosis 25-49% and a focal stenosis of 50-69% at the takeoff od D1. Mid and distal LAD ha only mild plaque. D1 has mild diffuse calcified plaque with stenosis 25-49% in the proximal portion. D2 has only minimal plaque. LCX is a small non-dominant artery that has no plaque. Other findings: Normal pulmonary vein drainage into the left atrium. Normal left atrial appendage without a thrombus. Normal size of the pulmonary artery. IMPRESSION: 1. Coronary calcium score of 439. This was 53 percentile for age and sex matched control. 2. Normal coronary origin with right dominance. 3.  CAD-RADS 3. Moderate stenosis in the distal LAD. Consider symptom-guided anti-ischemic pharmacotherapy as well as risk factor modification per guideline directed care. Additional analysis with CT FFR will be submitted. Electronically Signed   By: Tobias Alexander   On: 11/19/2019 16:34   Result Date: 11/19/2019 EXAM: OVER-READ INTERPRETATION  CT CHEST The following report is an over-read performed by radiologist Dr. Jeronimo Greaves of [facility] on 11/19/2019. This over-read does not include interpretation of cardiac or coronary anatomy or pathology. The coronary CTA interpretation by the cardiologist is attached. COMPARISON:  11/13/2019 CTA of the chest. FINDINGS: Vascular: Aortic atherosclerosis. No dissection. No central pulmonary embolism, on this non-dedicated study. Mediastinum/Nodes:  1.0 cm subcarinal node is increased from 7 mm on the prior. Lungs/Pleura: Small right and trace left pleural fluid, new since the prior CT. Interval development of scattered pulmonary nodularity, primarily subpleural in distribution and on the order of 4 mm and less. Some areas of minimal basilar predominant septal thickening are also new. Right base atelectasis. Upper Abdomen: Normal imaged portions of the liver, spleen, stomach. Musculoskeletal: Midthoracic spondylosis. IMPRESSION: 1. Since 11/13/2019, development of small bilateral pleural effusions, mild basilar prominent septal thickening, and scattered small pulmonary nodules. Differential considerations include mild interstitial edema/congestive heart failure (with the areas of nodularity representing subpleural lymph nodes) or mild atypical infection. 2.  Aortic Atherosclerosis (ICD10-I70.0). Electronically Signed: By: Jeronimo Greaves M.D. On: 11/19/2019 11:50   CT CORONARY FRACTIONAL FLOW RESERVE DATA PREP  Result Date: 11/22/2019 EXAM: CT FFR ANALYSIS CLINICAL DATA:  74 year old female with abnormal CCTA. FINDINGS: FFRct analysis was performed on the original cardiac CT angiogram dataset. Diagrammatic representation of the FFRct analysis is provided in a separate PDF document in PACS. This dictation was created using the PDF document and an interactive 3D model of the results. 3D model is not available in the EMR/PACS. Normal FFR range is >0.80. 1. Left Main: 0.99. 2. LAD: Proximal LAD: 0.97, mid 0.72, distal 0.69. 3. D1: 0.91. 4. D2: 0.84. 5. LCX: 0.88. 6. RCA: 0.95. IMPRESSION: 1. CT FFR analysis showed significant stenosis in the proximal to mid LAD. Cardiac catheterization is recommended once she recovers from urosepsis. Electronically Signed   By: Tobias Alexander   On: 11/22/2019 20:25   IR NEPHROSTOMY PLACEMENT LEFT  Result Date: 11/16/2019 INDICATION: Obstructing nephrolithiasis, hydronephrosis EXAM: ULTRASOUND FLUOROSCOPIC 10 FRENCH LEFT  NEPHROSTOMY INSERTION COMPARISON:  11/14/2019 MEDICATIONS: Patient is already receiving IV antibiotics as an inpatient. ANESTHESIA/SEDATION: Fentanyl 50 mcg IV; Versed 1.0 mg IV Moderate Sedation Time:  11 minutes The patient was continuously monitored during the procedure by the interventional radiology nurse under my direct supervision. CONTRAST:  5 cc-administered into the collecting system(s) FLUOROSCOPY TIME:  Fluoroscopy Time: 0 minutes 6 seconds (5 mGy). COMPLICATIONS: None immediate. PROCEDURE: Informed written consent was obtained from the patient after a thorough discussion of the procedural risks, benefits and alternatives. All questions were addressed. Maximal Sterile Barrier Technique was utilized including caps, mask, sterile gowns, sterile gloves, sterile drape, hand hygiene and skin antiseptic. A timeout was performed prior to the initiation of the procedure. Previous imaging reviewed. Patient position prone. Preliminary ultrasound performed. The hydronephrotic left kidney was localized and marked. Under sterile conditions and local anesthesia, percutaneous needle access performed of a mid to lower pole calyx. Needle position confirmed with ultrasound. Images obtained for documentation. There was return of purulent urine. Sample sent for culture. Guidewire inserted followed by the Accustick dilator set. Amplatz guidewire inserted into the renal pelvis. Tract  dilatation performed to insert a 10 French nephrostomy. Nephrostomy catheter confirmed in the renal pelvis. Contrast injection confirms position. Syringe aspiration yielded purulent urine. Catheter secured with Prolene suture and connected to external gravity drainage bag. Sterile dressing applied. No immediate complication. Patient tolerated the procedure well. IMPRESSION: Successful ultrasound and fluoroscopic 10 French left nephrostomy insertion Electronically Signed   By: Judie Petit.  Shick M.D.   On: 11/16/2019 14:27    Disposition   Pt is being  discharged home today in good condition.  Follow-up Plans & Appointments    Follow-up Information    Revankar, Aundra Dubin, MD Follow up.   Specialty: Cardiology Why: We have arranged a follow-up visit on Friday Dec 09, 2019 at 11:20 AM  (Arrive by 11:05 AM). This will be at the [place] [facility]. Contact information: [address] [place]  [place] [address] [phone]        Abnormal Urine Color Follow up.   Why: If you still have abnormal color of your urine tomorrow, please call your urologist. You can also call our office for further instructions.         Discharge Instructions    Amb Referral to Cardiac Rehabilitation   Complete by: As directed    Referring to [place] CRP 2   Diagnosis: Coronary Stents   After initial evaluation and assessments completed: Virtual Based Care may be provided alone or in conjunction with Phase 2 Cardiac Rehab based on patient barriers.: Yes   Diet - low sodium heart healthy   Complete by: As directed    Discharge instructions   Complete by: As directed    IMPORTANT: Your metformin has to be stopped for at least 48 hours after a heart cath. You can restart this on 12/05/19.  You were started on a new blood thinner called Brilinta to take in addition to aspirin. If you notice any bleeding such as blood in stool, black tarry stools, blood in urine, nosebleeds or any other unusual bleeding, call your doctor immediately. It is not normal to have this kind of bleeding while on a blood thinner and usually indicates there is an underlying problem with one of your body systems that needs to be checked out.   We also provided you with a prescription for nitroglycerin as-needed.  Patients taking blood thinners should generally stay away from medicines like ibuprofen, Advil, Motrin, naproxen, and Aleve due to risk of stomach bleeding. You may take Tylenol as directed or talk to your primary doctor about alternatives.  We updated your  metoprolol prescripton to a 1/2 tablet twice a day - you can use what you have at home with a pill cutter. We sent in an updated prescription to your regular pharmacy so that they are aware of the change.   Increase activity slowly   Complete by: As directed    No driving for 2 days. No lifting over 5 lbs for 1 week. No sexual activity for 1 week. Keep procedure site clean & dry. If you notice increased pain, swelling, bleeding or pus, call/return!  You may shower, but no soaking baths/hot tubs/pools for 1 week.       Discharge Medications   Allergies as of 12/02/2019   No Known Allergies     Medication List    STOP taking these medications   ibuprofen 200 MG tablet Commonly known as: ADVIL     TAKE these medications   acetaminophen 325 MG tablet Commonly known as: TYLENOL Take 2  tablets (650 mg total) by mouth every 6 (six) hours as needed for mild pain (or Fever >/= 101).   alendronate 70 MG tablet Commonly known as: FOSAMAX Take 70 mg by mouth every Thursday. Take with a full glass of water on an empty stomach.   aspirin 81 MG EC tablet Take 1 tablet (81 mg total) by mouth daily.   atorvastatin 40 MG tablet Commonly known as: Lipitor Take 1 tablet (40 mg total) by mouth daily. What changed: when to take this   enalapril 20 MG tablet Commonly known as: VASOTEC Take 20 mg by mouth daily.   ferrous sulfate 325 (65 FE) MG tablet Take 325 mg by mouth daily.   fluticasone 50 MCG/ACT nasal spray Commonly known as: FLONASE Place 1-2 sprays into both nostrils daily as needed for allergies or rhinitis.   metFORMIN 1000 MG tablet Commonly known as: GLUCOPHAGE Take 1,000 mg by mouth 2 (two) times daily with a meal. Notes to patient: IMPORTANT: Your metformin has to be stopped for at least 48 hours after a heart cath. You can restart this on 12/05/19.    metoprolol tartrate 25 MG tablet Commonly known as: LOPRESSOR Take 0.5 tablets (12.5 mg total) by mouth 2 (two) times  daily. What changed: how much to take   nitroGLYCERIN 0.4 MG SL tablet Commonly known as: Nitrostat Place 1 tablet (0.4 mg total) under the tongue every 5 (five) minutes as needed for chest pain (up to 3 doses. If taking 3rd dose call 911).   polyethylene glycol 17 g packet Commonly known as: MIRALAX / GLYCOLAX Take 17 g by mouth daily as needed for mild constipation.   sertraline 50 MG tablet Commonly known as: ZOLOFT Take 50 mg by mouth daily.   sitaGLIPtin 25 MG tablet Commonly known as: JANUVIA Take 25 mg by mouth every evening.   ticagrelor 90 MG Tabs tablet Commonly known as: BRILINTA Take 1 tablet (90 mg total) by mouth 2 (two) times daily.   vitamin B-12 1000 MCG tablet Commonly known as: CYANOCOBALAMIN Take 1,000 mcg by mouth daily.   Vitamin D-3 125 MCG (5000 UT) Tabs Take 5,000 Units by mouth daily.          Allergies No Known Allergies  Outstanding Labs/Studies   N/A  Duration of Discharge Encounter   Greater than 30 minutes including physician time.  Signed, Laurann Montana, PA-C 12/02/2019, 4:59 PM  I have examined the patient and reviewed assessment and plan and discussed with patient.  Agree with above as stated.  S/p LAD/diagonal PCI.  RIght forearm hematoma post procedure but radial pulse intact.  Better after compression.  She has had kidney stone/urosepsis issues. She has nephrostomy and urine is slightly pink after today's procedure, likely related to anticoagulation.  Brilinta used due to prior NSTEMI.  OK to stop aspirin after a week if there are more concerns about bleeding.  Ultimately, could switch to clopidogrel if bleeding issues worsened.    F/u with Dr. Tomie China next week.  Lance Muss

## 2019-12-02 NOTE — Progress Notes (Signed)
Inpatient pharmacist in to speak with patient.

## 2019-12-02 NOTE — Telephone Encounter (Signed)
   Patient likely being discharged this evening after 5pm, same day PCI. Has f/u in [facility] office with Dr. Tomie China 1/22. Usually goes to [facility] but appt moved given his same day discharge. Will just need TOC call affer discharge. Thanks! Nuria Phebus PA-C

## 2019-12-02 NOTE — Discharge Instructions (Addendum)
Information about your medication: Brilinta (anti-platelet agent)  Generic Name (Brand): ticagrelor (Brilinta), twice daily medication  PURPOSE: You are taking this medication along with aspirin to lower your chance of having a heart attack, stroke, or blood clots in your heart stent. These can be fatal. Brilinta and aspirin help prevent platelets from sticking together and forming a clot that can block an artery or your stent.   Common SIDE EFFECTS you may experience include: bruising or bleeding more easily, shortness of breath  Do not stop taking BRILINTA without talking to the doctor who prescribes it for you. People who are treated with a stent and stop taking Brilinta too soon, have a higher risk of getting a blood clot in the stent, having a heart attack, or dying. If you stop Brilinta because of bleeding, or for other reasons, your risk of a heart attack or stroke may increase.   Tell all of your doctors and dentists that you are taking Brilinta. They should talk to the doctor who prescribed Brilinta for you before you have any surgery or invasive procedure.   Contact your health care provider if you experience: severe or uncontrollable bleeding, pink/red/brown urine, vomiting blood or vomit that looks like "coffee grounds", red or black stools (looks like tar), coughing up blood or blood clots ---------------------------------------------------------------------------------------------------------------------- *If there is continued blood tinge to your urine tomorrow please call the urologist.* *If there still continues to be blood in your urine please lt the MD know at your follow up visit.*   DRINK PLENTY OF FLUIDS FOR THE NEXT 2-3 DAYS.  KEEP ARM ELEVATED THE REMAINDER OF THE DAY.  HOLD METFORMIN FOR A FULL 48 HOURS AFTER DISCHARGE.   Radial Site Care  This sheet gives you information about how to care for yourself after your procedure. Your health care provider may also give  you more specific instructions. If you have problems or questions, contact your health care provider. What can I expect after the procedure? After the procedure, it is common to have:  Bruising and tenderness at the catheter insertion area. Follow these instructions at home: Medicines  Take over-the-counter and prescription medicines only as told by your health care provider. Insertion site care  Follow instructions from your health care provider about how to take care of your insertion site. Make sure you: ? Wash your hands with soap and water before you change your bandage (dressing). If soap and water are not available, use hand sanitizer. ? Change your dressing as told by your health care provider.  Check your insertion site every day for signs of infection. Check for: ? Redness, swelling, or pain. ? Fluid or blood. ? Pus or a bad smell. ? Warmth.  Do not take baths, swim, or use a hot tub until your health care provider approves.  You may shower 24-48 hours after the procedure. ? Remove the dressing and gently wash the site with plain soap and water. ? Pat the area dry with a clean towel. ? Do not rub the site. That could cause bleeding.  Do not apply powder or lotion to the site. Activity   For 24 hours after the procedure, or as directed by your health care provider: ? Do not flex or bend the affected arm. ? Do not push or pull heavy objects with the affected arm. ? Do not drive yourself home from the hospital or clinic. You may drive 2 days after the procedure . ? Do not operate machinery or power tools.  Do not lift anything that is heavier than 5 lb for 1 week.  Ask your health care provider when it is okay to: ? Return to work or school. ? Resume usual physical activities or sports. ? Resume sexual activity. General instructions  If the catheter site starts to bleed, raise your arm and put firm pressure on the site. If the bleeding does not stop, get help  right away. This is a medical emergency.  If you went home on the same day as your procedure, a responsible adult should be with you for the first 24 hours after you arrive home.  Keep all follow-up visits as told by your health care provider. This is important. Contact a health care provider if:  You have a fever.  You have redness, swelling, or yellow drainage around your insertion site. Get help right away if:  You have unusual pain at the radial site.  The catheter insertion area swells very fast.  The insertion area is bleeding, and the bleeding does not stop when you hold steady pressure on the area.  Your arm or hand becomes pale, cool, tingly, or numb. These symptoms may represent a serious problem that is an emergency. Do not wait to see if the symptoms will go away. Get medical help right away. Call your local emergency services (911 in the [place] Do not drive yourself to the hospital. Summary  After the procedure, it is common to have bruising and tenderness at the site.  Follow instructions from your health care provider about how to take care of your radial site wound. Check the wound every day for signs of infection.  Do not lift anything that is heavier than 5 lb for 1 week.  This information is not intended to replace advice given to you by your health care provider. Make sure you discuss any questions you have with your health care provider. Document Revised: 12/09/2017 Document Reviewed: 12/09/2017 Elsevier Patient Education  2020 [employer].

## 2019-12-02 NOTE — Interval H&P Note (Signed)
Cath Lab Visit (complete for each Cath Lab visit)  Clinical Evaluation Leading to the Procedure:   ACS: No.  Non-ACS:    Anginal Classification: CCS III  Anti-ischemic medical therapy: Minimal Therapy (1 class of medications)  Non-Invasive Test Results: High-risk stress test findings: cardiac mortality >3%/year  Prior CABG: No previous CABG  CT concerning for prox LAD disease    History and Physical Interval Note:  12/02/2019 7:37 AM  Claudia Christian  has presented today for surgery, with the diagnosis of cad.  The various methods of treatment have been discussed with the patient and family. After consideration of risks, benefits and other options for treatment, the patient has consented to  Procedure(s): LEFT HEART CATH AND CORONARY ANGIOGRAPHY (N/A) as a surgical intervention.  The patient's history has been reviewed, patient examined, no change in status, stable for surgery.  I have reviewed the patient's chart and labs.  Questions were answered to the patient's satisfaction.     Lance Muss

## 2019-12-02 NOTE — Progress Notes (Signed)
At 1000 central monitoring called to inform patient was off the monitor. Upon entering room pt's right hand noted to have a bluish discoloration. Right wrist and anterior forearm swollen. Manual pressure held by this nurse and London Pepper, RN around forearm and wrist for 20 minutes. This nurse continued to hold pressure for 15 minutes longer. Palpable pulses noted and fingers/right hand no longer cyanotic. Dayna, PA called and in to see patient at 1045. Cath lab personnel in at 1050, applied manual pressure then pressure via manual blood pressure cuff. Cath lab instructed this nurse to leave cuff on until 1200 then remove and wait another 15 minutes before beginning to remove air from TR Band. Will monitor.

## 2019-12-02 NOTE — Progress Notes (Signed)
SS called due to development of radial hematoma. Per nurse, patient had noticed some puffiness around 10am. Tech got patient up to bathroom after which nurse noted fingers were blue and forearm becoming bruised/swollen. Bleeding also noted under TR band. Manual pressure was held and band adjusted. Patient feels that hand feels much better. Color has returned and normal sensation. D/w Dr. Eldridge Dace - may impact candidacy for same day PCI dc. He asked one of the cath lab team members to help co-manage hematoma with SS nurse. Have also updated pharmD about potential that patient may not go home - per her report we cannot pre-emptively send in Brilinta until we know plan (if patient stays, cannot keep OP meds at bedside per their report).  Also of note pt reports only taking Lopressor once daily because taking full dose twice a day makes her weak. Lopressor should be BID so we will plan to have her just do 1/2 tab BID at DC.  Caldonia Leap PA-C

## 2019-12-02 NOTE — Progress Notes (Signed)
[id number] Education completed with pt who voiced understanding. Stressed importance of brilinta with stent. Reviewed NTG use, heart healthy and diabetic diets, walking for ex., and CRP 2. Referred to [place] CRP 2. Luetta Nutting RN BSN 12/02/2019 12:53 PM

## 2019-12-02 NOTE — Progress Notes (Signed)
Cardiac rehab in to see patient. Blood pressure cuff removed from right forearm. Area soft with bruising noted. Right hand/fingers warm and pink.

## 2019-12-05 NOTE — Telephone Encounter (Signed)
Attempted to call patient to complete TOC documentation. No answer.

## 2019-12-06 DIAGNOSIS — A419 Sepsis, unspecified organism: Secondary | ICD-10-CM | POA: Diagnosis not present

## 2019-12-06 DIAGNOSIS — I10 Essential (primary) hypertension: Secondary | ICD-10-CM | POA: Diagnosis not present

## 2019-12-06 DIAGNOSIS — N132 Hydronephrosis with renal and ureteral calculous obstruction: Secondary | ICD-10-CM | POA: Diagnosis not present

## 2019-12-06 DIAGNOSIS — Z7982 Long term (current) use of aspirin: Secondary | ICD-10-CM | POA: Diagnosis not present

## 2019-12-06 DIAGNOSIS — E119 Type 2 diabetes mellitus without complications: Secondary | ICD-10-CM | POA: Diagnosis not present

## 2019-12-06 DIAGNOSIS — I214 Non-ST elevation (NSTEMI) myocardial infarction: Secondary | ICD-10-CM | POA: Diagnosis not present

## 2019-12-06 DIAGNOSIS — Z7984 Long term (current) use of oral hypoglycemic drugs: Secondary | ICD-10-CM | POA: Diagnosis not present

## 2019-12-06 DIAGNOSIS — N131 Hydronephrosis with ureteral stricture, not elsewhere classified: Secondary | ICD-10-CM | POA: Diagnosis not present

## 2019-12-09 ENCOUNTER — Ambulatory Visit (INDEPENDENT_AMBULATORY_CARE_PROVIDER_SITE_OTHER): Payer: Medicare PPO | Admitting: Cardiology

## 2019-12-09 ENCOUNTER — Encounter: Payer: Self-pay | Admitting: Cardiology

## 2019-12-09 ENCOUNTER — Other Ambulatory Visit: Payer: Self-pay

## 2019-12-09 VITALS — BP 110/62 | HR 68 | Ht 64.0 in | Wt 146.0 lb

## 2019-12-09 DIAGNOSIS — I2510 Atherosclerotic heart disease of native coronary artery without angina pectoris: Secondary | ICD-10-CM

## 2019-12-09 DIAGNOSIS — I10 Essential (primary) hypertension: Secondary | ICD-10-CM

## 2019-12-09 DIAGNOSIS — I25118 Atherosclerotic heart disease of native coronary artery with other forms of angina pectoris: Secondary | ICD-10-CM

## 2019-12-09 DIAGNOSIS — Z1329 Encounter for screening for other suspected endocrine disorder: Secondary | ICD-10-CM

## 2019-12-09 NOTE — Patient Instructions (Signed)
Medication Instructions:  Your physician recommends that you continue on your current medications as directed. Please refer to the Current Medication list given to you today.  *If you need a refill on your cardiac medications before your next appointment, please call your pharmacy*  Lab Work: Your physician recommends that you have a BMP, CBC, TSH, hepatic and lipid drawn today  If you have labs (blood work) drawn today and your tests are completely normal, you will receive your results only by: Marland Kitchen MyChart Message (if you have MyChart) OR . A paper copy in the mail If you have any lab test that is abnormal or we need to change your treatment, we will call you to review the results.  Testing/Procedures: NONE  Follow-Up: At [facility], you and your health needs are our priority.  As part of our continuing mission to provide you with exceptional heart care, we have created designated Provider Care Teams.  These Care Teams include your primary Cardiologist (physician) and Advanced Practice Providers (APPs -  Physician Assistants and Nurse Practitioners) who all work together to provide you with the care you need, when you need it.  Your next appointment:   2 month(s)  The format for your next appointment:   In Person  Provider:   Belva Crome, MD

## 2019-12-09 NOTE — Progress Notes (Signed)
Cardiology Office Note:    Date:  12/09/2019   ID:  Claudia Christian, DOB [birth year], MRN [id number]  PCP:  Buckner Malta, MD  Cardiologist:  Garwin Brothers, MD   Referring MD: Buckner Malta, MD    ASSESSMENT:    1. Coronary arteriosclerosis   2. Coronary artery disease involving native coronary artery of native heart with other form of angina pectoris (HCC)   3. Essential hypertension   4. Thyroid disorder screen    PLAN:    In order of problems listed above:  1. Coronary artery disease: Secondary prevention stressed to the patient.  Importance of compliance with diet and medication stressed and she vocalized understanding.  Coronary angiography report was discussed with her at extensive length and questions were answered to her satisfaction.  She plans to walk on a regular basis.  Exercise was stressed and diet 2. 2. Essential hypertension: Blood pressure is stable. 3. Mixed dyslipidemia: Diet was discussed.  She will be back in the next 3 to 4 months for a follow-up appointment and will have fasting blood work done at that visit.  Multiple questions about her hospital evaluation which were answered to her satisfaction.  Again before sending her for intervention I discussed with her urologist and her urologist had mentioned to me that there was no urgency for her surgery and it would be pushed for many months partly because of the Covid pandemic.  Patient is aware of this.   Medication Adjustments/Labs and Tests Ordered: Current medicines are reviewed at length with the patient today.  Concerns regarding medicines are outlined above.  Orders Placed This Encounter  Procedures   Basic Metabolic Panel (BMET)   CBC   TSH   Hepatic function panel   Lipid Profile   No orders of the defined types were placed in this encounter.    Chief Complaint  Patient presents with   Follow-up     History of Present Illness:    Claudia Christian is a 74 y.o. female.   Patient has known coronary artery disease.  She denies any problems at this time and takes care of activities of daily living.  No chest pain orthopnea or PND.  At the time of my evaluation, the patient is alert awake oriented and in no distress.  She was evaluated by me recently after hospital discharge hospital stay was very concerning for coronary artery disease.  I sent her for coronary angiography and she is undergone stenting.  I also discussed with her urologist at this time and he had mentioned that there was no rush for her to undergo any surgery or intervention.  He was also in favor of cardiac evaluation first.  Past Medical History:  Diagnosis Date   Diabetes mellitus type 2, controlled (HCC)    Essential hypertension    Hyperlipidemia    Nephrolithiasis     Past Surgical History:  Procedure Laterality Date   CORONARY BALLOON ANGIOPLASTY N/A 12/02/2019   Procedure: CORONARY BALLOON ANGIOPLASTY;  Surgeon: Corky Crafts, MD;  Location: [facility] INVASIVE CV LAB;  Service: Cardiovascular;  Laterality: N/A;   CORONARY STENT INTERVENTION N/A 12/02/2019   Procedure: CORONARY STENT INTERVENTION;  Surgeon: Corky Crafts, MD;  [facility] INVASIVE CV LAB;  Service: Cardiovascular;  Laterality: N/A;   INTRAVASCULAR ULTRASOUND/IVUS N/A 12/02/2019   Procedure: Intravascular Ultrasound/IVUS;  Surgeon: Corky Crafts, MD;  Location: [facility] INVASIVE CV LAB;  Service: Cardiovascular;  Laterality: N/A;   IR NEPHROSTOMY PLACEMENT LEFT  11/16/2019  LEFT HEART CATH AND CORONARY ANGIOGRAPHY N/A 12/02/2019   Procedure: LEFT HEART CATH AND CORONARY ANGIOGRAPHY;  Surgeon: Corky Crafts, MD;  Location: [facility] INVASIVE CV LAB;  Service: Cardiovascular;  Laterality: N/A;   LITHOTRIPSY     TUBAL LIGATION  1984    Current Medications: Current Meds  Medication Sig   acetaminophen (TYLENOL) 325 MG tablet Take 2 tablets (650 mg total) by mouth every 6 (six) hours as needed for mild pain  (or Fever >/= 101).   alendronate (FOSAMAX) 70 MG tablet Take 70 mg by mouth every Thursday. Take with a full glass of water on an empty stomach.    aspirin EC 81 MG EC tablet Take 1 tablet (81 mg total) by mouth daily.   atorvastatin (LIPITOR) 40 MG tablet Take 1 tablet (40 mg total) by mouth daily. (Patient taking differently: Take 40 mg by mouth every evening. )   Cholecalciferol (VITAMIN D-3) 125 MCG (5000 UT) TABS Take 5,000 Units by mouth daily.   enalapril (VASOTEC) 20 MG tablet Take 20 mg by mouth daily.   ferrous sulfate 325 (65 FE) MG tablet Take 325 mg by mouth daily.   fluticasone (FLONASE) 50 MCG/ACT nasal spray Place 1-2 sprays into both nostrils daily as needed for allergies or rhinitis.   metFORMIN (GLUCOPHAGE) 1000 MG tablet Take 1,000 mg by mouth 2 (two) times daily with a meal.   metoprolol tartrate (LOPRESSOR) 25 MG tablet Take 0.5 tablets (12.5 mg total) by mouth 2 (two) times daily.   nitroGLYCERIN (NITROSTAT) 0.4 MG SL tablet Place 1 tablet (0.4 mg total) under the tongue every 5 (five) minutes as needed for chest pain (up to 3 doses. If taking 3rd dose call 911).   polyethylene glycol (MIRALAX / GLYCOLAX) 17 g packet Take 17 g by mouth daily as needed for mild constipation.   sertraline (ZOLOFT) 50 MG tablet Take 50 mg by mouth daily.   sitaGLIPtin (JANUVIA) 25 MG tablet Take 25 mg by mouth every evening.    ticagrelor (BRILINTA) 90 MG TABS tablet Take 1 tablet (90 mg total) by mouth 2 (two) times daily.   vitamin B-12 (CYANOCOBALAMIN) 1000 MCG tablet Take 1,000 mcg by mouth daily.     Allergies:   Patient has no known allergies.   Social History   Socioeconomic History   Marital status: Married    Spouse name: Not on file   Number of children: Not on file   Years of education: Not on file   Highest education level: Not on file  Occupational History   Not on file  Tobacco Use   Smoking status: Never Smoker   Smokeless tobacco: Never Used   Substance and Sexual Activity   Alcohol use: Never   Drug use: Never   Sexual activity: Yes    Partners: Male  Other Topics Concern   Not on file  Social History Narrative   Not on file   Social Determinants of Health   Financial Resource Strain:    Difficulty of Paying Living Expenses: Not on file  Food Insecurity:    Worried About Running Out of Food in the Last Year: Not on file   Ran Out of Food in the Last Year: Not on file  Transportation Needs:    Lack of Transportation (Medical): Not on file   Lack of Transportation (Non-Medical): Not on file  Physical Activity:    Days of Exercise per Week: Not on file   Minutes of Exercise per Session: Not  on file  Stress:    Feeling of Stress : Not on file  Social Connections:    Frequency of Communication with Friends and Family: Not on file   Frequency of Social Gatherings with Friends and Family: Not on file   Attends Religious Services: Not on file   Active Member of Clubs or Organizations: Not on file   Attends Archivist Meetings: Not on file   Marital Status: Not on file     Family History: The patient's family history includes Heart attack in her brother, father, and mother.  ROS:   Please see the history of present illness.    All other systems reviewed and are negative.  EKGs/Labs/Other Studies Reviewed:    The following studies were reviewed today:  Study date: 12/02/19  MyChart Results Release  MyChart Status: Pending Results Release  Physicians  Panel Physicians Referring Physician Case Authorizing Physician  Jettie Booze, MD (Primary)    Procedures  CORONARY BALLOON ANGIOPLASTY  CORONARY STENT INTERVENTION  Intravascular Ultrasound/IVUS  LEFT HEART CATH AND CORONARY ANGIOGRAPHY  Conclusion    Mid LAD-2 lesion is 80% stenosed.  A drug-eluting stent was successfully placed using a SYNERGY XD 2.50X24.  Post intervention, there is a 0% residual  stenosis.  Mid LAD-1 lesion is 70% stenosed.  A drug-eluting stent was successfully placed using a SYNERGY XD 2.75X8.  Post intervention, there is a 0% residual stenosis.  2nd Diag lesion is 75% stenosed.  Balloon angioplasty was performed using a BALLOON SAPPHIRE 2.25X15.  Post intervention, there is a 25% residual stenosis.  The left ventricular systolic function is normal.  LV end diastolic pressure is normal.  The left ventricular ejection fraction is 55-65% by visual estimate.  There is no aortic valve stenosis.   Complex, mid LAD bifurcation lesion.  Size differential from proximal to mid LAD due to large diagonals originating from LAD.  Difficult to lay out mid LAD.  IVUS was helpful to show actual vessel size and guide postdilatation.       Recent Labs: 11/28/2019: ALT 11; BUN 16; Creatinine, Ser 0.85; Hemoglobin 11.1; Platelets 424; Potassium 4.9; Sodium 138; TSH 0.497  Recent Lipid Panel    Component Value Date/Time   CHOL 113 11/28/2019 1501   TRIG 208 (H) 11/28/2019 1501   HDL 36 (L) 11/28/2019 1501   CHOLHDL 3.1 11/28/2019 1501   LDLCALC 44 11/28/2019 1501    Physical Exam:    VS:  BP 110/62    Pulse 68    Ht 5\' 4"  (1.626 m)    Wt 146 lb (66.2 kg)    SpO2 98%    BMI 25.06 kg/m     Wt Readings from Last 3 Encounters:  12/09/19 146 lb (66.2 kg)  12/02/19 149 lb (67.6 kg)  11/28/19 149 lb (67.6 kg)     GEN: Patient is in no acute distress HEENT: Normal NECK: No JVD; No carotid bruits LYMPHATICS: No lymphadenopathy CARDIAC: Hear sounds regular, 2/6 systolic murmur at the apex. RESPIRATORY:  Clear to auscultation without rales, wheezing or rhonchi  ABDOMEN: Soft, non-tender, non-distended MUSCULOSKELETAL:  No edema; No deformity  SKIN: Warm and dry NEUROLOGIC:  Alert and oriented x 3 PSYCHIATRIC:  Normal affect   Signed, Jenean Lindau, MD  12/09/2019 1:33 PM    [facility]

## 2019-12-12 DIAGNOSIS — I10 Essential (primary) hypertension: Secondary | ICD-10-CM | POA: Diagnosis not present

## 2019-12-12 DIAGNOSIS — A419 Sepsis, unspecified organism: Secondary | ICD-10-CM | POA: Diagnosis not present

## 2019-12-12 DIAGNOSIS — N131 Hydronephrosis with ureteral stricture, not elsewhere classified: Secondary | ICD-10-CM | POA: Diagnosis not present

## 2019-12-12 DIAGNOSIS — N132 Hydronephrosis with renal and ureteral calculous obstruction: Secondary | ICD-10-CM | POA: Diagnosis not present

## 2019-12-12 DIAGNOSIS — Z7984 Long term (current) use of oral hypoglycemic drugs: Secondary | ICD-10-CM | POA: Diagnosis not present

## 2019-12-12 DIAGNOSIS — I214 Non-ST elevation (NSTEMI) myocardial infarction: Secondary | ICD-10-CM | POA: Diagnosis not present

## 2019-12-12 DIAGNOSIS — E119 Type 2 diabetes mellitus without complications: Secondary | ICD-10-CM | POA: Diagnosis not present

## 2019-12-12 DIAGNOSIS — Z7982 Long term (current) use of aspirin: Secondary | ICD-10-CM | POA: Diagnosis not present

## 2019-12-13 ENCOUNTER — Other Ambulatory Visit (HOSPITAL_COMMUNITY): Payer: Self-pay | Admitting: Urology

## 2019-12-13 DIAGNOSIS — N202 Calculus of kidney with calculus of ureter: Secondary | ICD-10-CM | POA: Diagnosis not present

## 2019-12-13 DIAGNOSIS — R8271 Bacteriuria: Secondary | ICD-10-CM | POA: Diagnosis not present

## 2019-12-14 DIAGNOSIS — A419 Sepsis, unspecified organism: Secondary | ICD-10-CM | POA: Diagnosis not present

## 2019-12-15 ENCOUNTER — Ambulatory Visit: Payer: Medicare PPO | Admitting: Cardiology

## 2019-12-20 DIAGNOSIS — Z7984 Long term (current) use of oral hypoglycemic drugs: Secondary | ICD-10-CM | POA: Diagnosis not present

## 2019-12-20 DIAGNOSIS — Z7982 Long term (current) use of aspirin: Secondary | ICD-10-CM | POA: Diagnosis not present

## 2019-12-20 DIAGNOSIS — E119 Type 2 diabetes mellitus without complications: Secondary | ICD-10-CM | POA: Diagnosis not present

## 2019-12-20 DIAGNOSIS — N131 Hydronephrosis with ureteral stricture, not elsewhere classified: Secondary | ICD-10-CM | POA: Diagnosis not present

## 2019-12-20 DIAGNOSIS — I10 Essential (primary) hypertension: Secondary | ICD-10-CM | POA: Diagnosis not present

## 2019-12-20 DIAGNOSIS — A419 Sepsis, unspecified organism: Secondary | ICD-10-CM | POA: Diagnosis not present

## 2019-12-20 DIAGNOSIS — N132 Hydronephrosis with renal and ureteral calculous obstruction: Secondary | ICD-10-CM | POA: Diagnosis not present

## 2019-12-20 DIAGNOSIS — I214 Non-ST elevation (NSTEMI) myocardial infarction: Secondary | ICD-10-CM | POA: Diagnosis not present

## 2019-12-21 DIAGNOSIS — Z955 Presence of coronary angioplasty implant and graft: Secondary | ICD-10-CM | POA: Diagnosis not present

## 2019-12-21 DIAGNOSIS — I252 Old myocardial infarction: Secondary | ICD-10-CM | POA: Diagnosis not present

## 2019-12-21 DIAGNOSIS — E119 Type 2 diabetes mellitus without complications: Secondary | ICD-10-CM | POA: Diagnosis not present

## 2019-12-21 DIAGNOSIS — D649 Anemia, unspecified: Secondary | ICD-10-CM | POA: Diagnosis not present

## 2019-12-21 DIAGNOSIS — I10 Essential (primary) hypertension: Secondary | ICD-10-CM | POA: Diagnosis not present

## 2019-12-23 DIAGNOSIS — Z955 Presence of coronary angioplasty implant and graft: Secondary | ICD-10-CM | POA: Diagnosis not present

## 2019-12-23 DIAGNOSIS — I252 Old myocardial infarction: Secondary | ICD-10-CM | POA: Diagnosis not present

## 2019-12-23 DIAGNOSIS — E119 Type 2 diabetes mellitus without complications: Secondary | ICD-10-CM | POA: Diagnosis not present

## 2019-12-23 DIAGNOSIS — D649 Anemia, unspecified: Secondary | ICD-10-CM | POA: Diagnosis not present

## 2019-12-23 DIAGNOSIS — I10 Essential (primary) hypertension: Secondary | ICD-10-CM | POA: Diagnosis not present

## 2019-12-26 DIAGNOSIS — E119 Type 2 diabetes mellitus without complications: Secondary | ICD-10-CM | POA: Diagnosis not present

## 2019-12-26 DIAGNOSIS — I10 Essential (primary) hypertension: Secondary | ICD-10-CM | POA: Diagnosis not present

## 2019-12-26 DIAGNOSIS — D649 Anemia, unspecified: Secondary | ICD-10-CM | POA: Diagnosis not present

## 2019-12-26 DIAGNOSIS — I252 Old myocardial infarction: Secondary | ICD-10-CM | POA: Diagnosis not present

## 2019-12-26 DIAGNOSIS — Z955 Presence of coronary angioplasty implant and graft: Secondary | ICD-10-CM | POA: Diagnosis not present

## 2019-12-28 ENCOUNTER — Other Ambulatory Visit: Payer: Self-pay

## 2019-12-28 ENCOUNTER — Ambulatory Visit (HOSPITAL_COMMUNITY)
Admission: RE | Admit: 2019-12-28 | Discharge: 2019-12-28 | Disposition: A | Discharge status: Home / Self Care | Payer: Medicare PPO | Source: Ambulatory Visit | Attending: Urology | Admitting: Urology

## 2019-12-28 ENCOUNTER — Other Ambulatory Visit (HOSPITAL_COMMUNITY): Payer: Self-pay | Admitting: Urology

## 2019-12-28 DIAGNOSIS — N202 Calculus of kidney with calculus of ureter: Secondary | ICD-10-CM

## 2019-12-28 DIAGNOSIS — N135 Crossing vessel and stricture of ureter without hydronephrosis: Secondary | ICD-10-CM | POA: Diagnosis not present

## 2019-12-28 DIAGNOSIS — Z466 Encounter for fitting and adjustment of urinary device: Secondary | ICD-10-CM | POA: Insufficient documentation

## 2019-12-28 DIAGNOSIS — Z436 Encounter for attention to other artificial openings of urinary tract: Secondary | ICD-10-CM | POA: Diagnosis not present

## 2019-12-28 DIAGNOSIS — N200 Calculus of kidney: Secondary | ICD-10-CM | POA: Diagnosis not present

## 2019-12-28 HISTORY — PX: IR NEPHROSTOMY EXCHANGE LEFT: IMG6069

## 2019-12-28 MED ORDER — IOHEXOL 300 MG/ML  SOLN
50.0000 mL | Freq: Once | INTRAMUSCULAR | Status: AC | PRN
  Administered 2019-12-28: 7 mL

## 2019-12-28 MED ORDER — LIDOCAINE HCL 1 % IJ SOLN
INTRAMUSCULAR | Status: AC
  Filled 2019-12-28: qty 20

## 2019-12-28 MED ORDER — LIDOCAINE HCL 1 % IJ SOLN
INTRAMUSCULAR | Status: DC | PRN
  Administered 2019-12-28: 5 mL

## 2019-12-28 NOTE — Procedures (Signed)
Interventional Radiology Procedure:   Indications: Nephrolithiasis and left nephrostomy tube, needs routine exchange.  Procedure: Left nephrostomy tube exchange  Findings: 10.2 Fr drain in left renal pelvis  Complications: none     EBL: none  Plan: 6 week exchange.   Taro Hidrogo R. Lowella Dandy, MD  Pager: [phone]

## 2019-12-30 ENCOUNTER — Ambulatory Visit: Payer: Medicare PPO | Admitting: Cardiology

## 2019-12-30 DIAGNOSIS — E119 Type 2 diabetes mellitus without complications: Secondary | ICD-10-CM | POA: Diagnosis not present

## 2019-12-30 DIAGNOSIS — Z955 Presence of coronary angioplasty implant and graft: Secondary | ICD-10-CM | POA: Diagnosis not present

## 2019-12-30 DIAGNOSIS — I252 Old myocardial infarction: Secondary | ICD-10-CM | POA: Diagnosis not present

## 2019-12-30 DIAGNOSIS — D649 Anemia, unspecified: Secondary | ICD-10-CM | POA: Diagnosis not present

## 2019-12-30 DIAGNOSIS — I10 Essential (primary) hypertension: Secondary | ICD-10-CM | POA: Diagnosis not present

## 2020-01-02 DIAGNOSIS — E119 Type 2 diabetes mellitus without complications: Secondary | ICD-10-CM | POA: Diagnosis not present

## 2020-01-02 DIAGNOSIS — I10 Essential (primary) hypertension: Secondary | ICD-10-CM | POA: Diagnosis not present

## 2020-01-02 DIAGNOSIS — D649 Anemia, unspecified: Secondary | ICD-10-CM | POA: Diagnosis not present

## 2020-01-02 DIAGNOSIS — Z955 Presence of coronary angioplasty implant and graft: Secondary | ICD-10-CM | POA: Diagnosis not present

## 2020-01-02 DIAGNOSIS — I252 Old myocardial infarction: Secondary | ICD-10-CM | POA: Diagnosis not present

## 2020-01-04 DIAGNOSIS — I10 Essential (primary) hypertension: Secondary | ICD-10-CM | POA: Diagnosis not present

## 2020-01-04 DIAGNOSIS — D649 Anemia, unspecified: Secondary | ICD-10-CM | POA: Diagnosis not present

## 2020-01-04 DIAGNOSIS — I252 Old myocardial infarction: Secondary | ICD-10-CM | POA: Diagnosis not present

## 2020-01-04 DIAGNOSIS — E119 Type 2 diabetes mellitus without complications: Secondary | ICD-10-CM | POA: Diagnosis not present

## 2020-01-04 DIAGNOSIS — Z955 Presence of coronary angioplasty implant and graft: Secondary | ICD-10-CM | POA: Diagnosis not present

## 2020-01-06 DIAGNOSIS — Z955 Presence of coronary angioplasty implant and graft: Secondary | ICD-10-CM | POA: Diagnosis not present

## 2020-01-06 DIAGNOSIS — E119 Type 2 diabetes mellitus without complications: Secondary | ICD-10-CM | POA: Diagnosis not present

## 2020-01-06 DIAGNOSIS — I252 Old myocardial infarction: Secondary | ICD-10-CM | POA: Diagnosis not present

## 2020-01-06 DIAGNOSIS — I10 Essential (primary) hypertension: Secondary | ICD-10-CM | POA: Diagnosis not present

## 2020-01-06 DIAGNOSIS — D649 Anemia, unspecified: Secondary | ICD-10-CM | POA: Diagnosis not present

## 2020-01-09 DIAGNOSIS — I10 Essential (primary) hypertension: Secondary | ICD-10-CM | POA: Diagnosis not present

## 2020-01-09 DIAGNOSIS — E119 Type 2 diabetes mellitus without complications: Secondary | ICD-10-CM | POA: Diagnosis not present

## 2020-01-09 DIAGNOSIS — I252 Old myocardial infarction: Secondary | ICD-10-CM | POA: Diagnosis not present

## 2020-01-09 DIAGNOSIS — D649 Anemia, unspecified: Secondary | ICD-10-CM | POA: Diagnosis not present

## 2020-01-09 DIAGNOSIS — Z955 Presence of coronary angioplasty implant and graft: Secondary | ICD-10-CM | POA: Diagnosis not present

## 2020-01-11 DIAGNOSIS — Z955 Presence of coronary angioplasty implant and graft: Secondary | ICD-10-CM | POA: Diagnosis not present

## 2020-01-11 DIAGNOSIS — I252 Old myocardial infarction: Secondary | ICD-10-CM | POA: Diagnosis not present

## 2020-01-11 DIAGNOSIS — D649 Anemia, unspecified: Secondary | ICD-10-CM | POA: Diagnosis not present

## 2020-01-11 DIAGNOSIS — E119 Type 2 diabetes mellitus without complications: Secondary | ICD-10-CM | POA: Diagnosis not present

## 2020-01-11 DIAGNOSIS — I10 Essential (primary) hypertension: Secondary | ICD-10-CM | POA: Diagnosis not present

## 2020-01-13 DIAGNOSIS — E119 Type 2 diabetes mellitus without complications: Secondary | ICD-10-CM | POA: Diagnosis not present

## 2020-01-13 DIAGNOSIS — I252 Old myocardial infarction: Secondary | ICD-10-CM | POA: Diagnosis not present

## 2020-01-13 DIAGNOSIS — I10 Essential (primary) hypertension: Secondary | ICD-10-CM | POA: Diagnosis not present

## 2020-01-13 DIAGNOSIS — D649 Anemia, unspecified: Secondary | ICD-10-CM | POA: Diagnosis not present

## 2020-01-13 DIAGNOSIS — Z955 Presence of coronary angioplasty implant and graft: Secondary | ICD-10-CM | POA: Diagnosis not present

## 2020-01-16 DIAGNOSIS — Z955 Presence of coronary angioplasty implant and graft: Secondary | ICD-10-CM | POA: Diagnosis not present

## 2020-01-18 DIAGNOSIS — Z955 Presence of coronary angioplasty implant and graft: Secondary | ICD-10-CM | POA: Diagnosis not present

## 2020-01-20 DIAGNOSIS — Z955 Presence of coronary angioplasty implant and graft: Secondary | ICD-10-CM | POA: Diagnosis not present

## 2020-01-23 DIAGNOSIS — Z955 Presence of coronary angioplasty implant and graft: Secondary | ICD-10-CM | POA: Diagnosis not present

## 2020-01-25 DIAGNOSIS — Z955 Presence of coronary angioplasty implant and graft: Secondary | ICD-10-CM | POA: Diagnosis not present

## 2020-01-27 DIAGNOSIS — Z955 Presence of coronary angioplasty implant and graft: Secondary | ICD-10-CM | POA: Diagnosis not present

## 2020-01-30 ENCOUNTER — Telehealth: Payer: Self-pay | Admitting: Cardiology

## 2020-01-30 DIAGNOSIS — Z955 Presence of coronary angioplasty implant and graft: Secondary | ICD-10-CM | POA: Diagnosis not present

## 2020-01-30 NOTE — Telephone Encounter (Signed)
Patient states she is calling to inquire about whether or not she may come in to the office to have lab work done prior to appointment scheduled for 02/07/20 at 1:55 PM with Dr. Tomie China.   She states she would also like to speak with Dr. Kem Parkinson nurse in regards to whether it is necessary for her to take medication prior to a dentist appointment. Please advise.

## 2020-01-30 NOTE — Telephone Encounter (Signed)
We are recommending the COVID-19 vaccine to all of our patients. Cardiac medications (including blood thinners) should not deter anyone from being vaccinated and there is no need to hold any of those medications prior to vaccine administration.     Currently, there is a hotline to call (active 11/25/19) to schedule vaccination appointments as no walk-ins will be accepted.   Number: [phone].    If an appointment is not available please go to [email or web address] to sign up for notification when additional vaccine appointments are available.   If you have further questions or concerns about the vaccine process, please visit [email or web address] or contact your primary care physician.  I have informed the patient of the instructions. 

## 2020-01-30 NOTE — Telephone Encounter (Signed)
Called patient back about her call. Informed patient that her lab work she has on order are fasting. Informed patient that she could go in any time this week in the morning and have lab work drawn as long as she has been fasting.   Patient also wanted to know if she needs to be premedicated before her dental appointments. Informed patient that valve replacements, TAVRs, and congential heart issues need premedicated, but will check with Dr. Tomie China to make sure.

## 2020-01-31 NOTE — Telephone Encounter (Signed)
I agree.  I do not think she has any of this. Ladonna Snide, please review her records and confirm this.  To the best of my ability she does not need any antibiotics.

## 2020-02-01 DIAGNOSIS — I25118 Atherosclerotic heart disease of native coronary artery with other forms of angina pectoris: Secondary | ICD-10-CM | POA: Diagnosis not present

## 2020-02-01 DIAGNOSIS — Z1329 Encounter for screening for other suspected endocrine disorder: Secondary | ICD-10-CM | POA: Diagnosis not present

## 2020-02-01 DIAGNOSIS — I2510 Atherosclerotic heart disease of native coronary artery without angina pectoris: Secondary | ICD-10-CM | POA: Diagnosis not present

## 2020-02-01 DIAGNOSIS — I10 Essential (primary) hypertension: Secondary | ICD-10-CM | POA: Diagnosis not present

## 2020-02-01 DIAGNOSIS — Z955 Presence of coronary angioplasty implant and graft: Secondary | ICD-10-CM | POA: Diagnosis not present

## 2020-02-02 LAB — HEPATIC FUNCTION PANEL
ALT: 12 IU/L (ref 0–32)
AST: 12 IU/L (ref 0–40)
Albumin: 4.2 g/dL (ref 3.7–4.7)
Alkaline Phosphatase: 75 IU/L (ref 39–117)
Bilirubin Total: 0.2 mg/dL (ref 0.0–1.2)
Bilirubin, Direct: 0.08 mg/dL (ref 0.00–0.40)
Total Protein: 7.2 g/dL (ref 6.0–8.5)

## 2020-02-02 LAB — TSH: TSH: 1.5 u[IU]/mL (ref 0.450–4.500)

## 2020-02-02 LAB — BASIC METABOLIC PANEL
BUN/Creatinine Ratio: 19 (ref 12–28)
BUN: 18 mg/dL (ref 8–27)
CO2: 21 mmol/L (ref 20–29)
Calcium: 10 mg/dL (ref 8.7–10.3)
Chloride: 100 mmol/L (ref 96–106)
Creatinine, Ser: 0.97 mg/dL (ref 0.57–1.00)
GFR calc Af Amer: 68 mL/min/{1.73_m2} (ref >59)
GFR calc non Af Amer: 59 mL/min/{1.73_m2} — ABNORMAL LOW (ref >59)
Glucose: 123 mg/dL — ABNORMAL HIGH (ref 65–99)
Potassium: 4.5 mmol/L (ref 3.5–5.2)
Sodium: 134 mmol/L (ref 134–144)

## 2020-02-02 LAB — CBC
Hematocrit: 34.5 % (ref 34.0–46.6)
Hemoglobin: 11.4 g/dL (ref 11.1–15.9)
MCH: 28.4 pg (ref 26.6–33.0)
MCHC: 33 g/dL (ref 31.5–35.7)
MCV: 86 fL (ref 79–97)
Platelets: 271 10*3/uL (ref 150–450)
RBC: 4.02 x10E6/uL (ref 3.77–5.28)
RDW: 13.3 % (ref 11.7–15.4)
WBC: 7.9 10*3/uL (ref 3.4–10.8)

## 2020-02-02 LAB — LIPID PANEL
Chol/HDL Ratio: 3.1 ratio (ref 0.0–4.4)
Cholesterol, Total: 99 mg/dL — ABNORMAL LOW (ref 100–199)
HDL: 32 mg/dL — ABNORMAL LOW (ref >39)
LDL Chol Calc (NIH): 41 mg/dL (ref 0–99)
Triglycerides: 151 mg/dL — ABNORMAL HIGH (ref 0–149)
VLDL Cholesterol Cal: 26 mg/dL (ref 5–40)

## 2020-02-02 NOTE — Telephone Encounter (Signed)
I do not find any cardiac procedures in the pt's hx that would require her to have antibiotics. I will notify the pt.

## 2020-02-02 NOTE — Telephone Encounter (Signed)
1005 attempted to call pt without success.

## 2020-02-03 DIAGNOSIS — Z955 Presence of coronary angioplasty implant and graft: Secondary | ICD-10-CM | POA: Diagnosis not present

## 2020-02-06 DIAGNOSIS — Z955 Presence of coronary angioplasty implant and graft: Secondary | ICD-10-CM | POA: Diagnosis not present

## 2020-02-07 ENCOUNTER — Ambulatory Visit: Payer: Medicare PPO | Admitting: Cardiology

## 2020-02-07 ENCOUNTER — Other Ambulatory Visit: Payer: Self-pay

## 2020-02-07 ENCOUNTER — Encounter: Payer: Self-pay | Admitting: Cardiology

## 2020-02-07 VITALS — BP 114/68 | HR 56 | Ht 64.0 in | Wt 143.0 lb

## 2020-02-07 DIAGNOSIS — E785 Hyperlipidemia, unspecified: Secondary | ICD-10-CM | POA: Diagnosis not present

## 2020-02-07 DIAGNOSIS — E1169 Type 2 diabetes mellitus with other specified complication: Secondary | ICD-10-CM

## 2020-02-07 DIAGNOSIS — I10 Essential (primary) hypertension: Secondary | ICD-10-CM

## 2020-02-07 DIAGNOSIS — I2510 Atherosclerotic heart disease of native coronary artery without angina pectoris: Secondary | ICD-10-CM

## 2020-02-07 MED ORDER — CLOPIDOGREL BISULFATE 300 MG PO TABS: 300.0000 mg | ORAL_TABLET | Freq: Once | ORAL | 0 refills | Status: AC

## 2020-02-07 MED ORDER — CLOPIDOGREL BISULFATE 75 MG PO TABS: 75.0000 mg | ORAL_TABLET | Freq: Every day | ORAL | 3 refills | Status: DC

## 2020-02-07 NOTE — Patient Instructions (Addendum)
Medication Instructions:  Your physician has recommended you make the following change in your medication:   Stop Brilianta 12 hours after last dose of Brililanta take the 300 mg dose of Plavix. 24 hours after 300 mg dose of Plavix start 75 mg Plavix daily and continue daily.  *If you need a refill on your cardiac medications before your next appointment, please call your pharmacy*   Lab Work: None ordered If you have labs (blood work) drawn today and your tests are completely normal, you will receive your results only by: Marland Kitchen MyChart Message (if you have MyChart) OR . A paper copy in the mail If you have any lab test that is abnormal or we need to change your treatment, we will call you to review the results.   Testing/Procedures: None ordered   Follow-Up: At [facility], you and your health needs are our priority.  As part of our continuing mission to provide you with exceptional heart care, we have created designated Provider Care Teams.  These Care Teams include your primary Cardiologist (physician) and Advanced Practice Providers (APPs -  Physician Assistants and Nurse Practitioners) who all work together to provide you with the care you need, when you need it.  We recommend signing up for the patient portal called "MyChart".  Sign up information is provided on this After Visit Summary.  MyChart is used to connect with patients for Virtual Visits (Telemedicine).  Patients are able to view lab/test results, encounter notes, upcoming appointments, etc.  Non-urgent messages can be sent to your provider as well.   To learn more about what you can do with MyChart, go to [email or web address].    Your next appointment:   6 month(s)  The format for your next appointment:   In Person  Provider:   Belva Crome, MD   Other Instructions  Have a great day!!  Clopidogrel tablets What is this medicine? CLOPIDOGREL (kloh PID oh grel) helps to prevent blood clots. This medicine  is used to prevent heart attack, stroke, or other vascular events in people who are at high risk. This medicine may be used for other purposes; ask your health care provider or pharmacist if you have questions. COMMON BRAND NAME(S): Plavix What should I tell my health care provider before I take this medicine? They need to know if you have any of the following conditions:  bleeding disorders  bleeding in the brain  having surgery  history of stomach bleeding  an unusual or allergic reaction to clopidogrel, other medicines, foods, dyes, or preservatives  pregnant or trying to get pregnant  breast-feeding How should I use this medicine? Take this medicine by mouth with a glass of water. Follow the directions on the prescription label. You may take this medicine with or without food. If it upsets your stomach, take it with food. Take your medicine at regular intervals. Do not take it more often than directed. Do not stop taking except on your doctor's advice. A special MedGuide will be given to you by the pharmacist with each prescription and refill. Be sure to read this information carefully each time. Talk to your pediatrician regarding the use of this medicine in children. Special care may be needed. Overdosage: If you think you have taken too much of this medicine contact a poison control center or emergency room at once. NOTE: This medicine is only for you. Do not share this medicine with others. What if I miss a dose? If you miss a dose, take it  as soon as you can. If it is almost time for your next dose, take only that dose. Do not take double or extra doses. What may interact with this medicine? Do not take this medicine with the following medications:  dasabuvir; ombitasvir; paritaprevir; ritonavir  defibrotide  selexipag This medicine may also interact with the following medications:  certain medicines that treat or prevent blood clots like warfarin  narcotic medicines  for pain  NSAIDs, medicines for pain and inflammation, like ibuprofen or naproxen  repaglinide  SNRIs, medicines for depression, like desvenlafaxine, duloxetine, levomilnacipran, venlafaxine  SSRIs, medicines for depression, like citalopram, escitalopram, fluoxetine, fluvoxamine, paroxetine, sertraline  stomach acid blockers like cimetidine, esomeprazole, omeprazole This list may not describe all possible interactions. Give your health care provider a list of all the medicines, herbs, non-prescription drugs, or dietary supplements you use. Also tell them if you smoke, drink alcohol, or use illegal drugs. Some items may interact with your medicine. What should I watch for while using this medicine? Visit your doctor or health care professional for regular check-ups. Do not stop taking your medicine unless your doctor tells you to. Notify your doctor or health care professional and seek emergency treatment if you develop breathing problems; changes in vision; chest pain; severe, sudden headache; pain, swelling, warmth in the leg; trouble speaking; sudden numbness or weakness of the face, arm or leg. These can be signs that your condition has gotten worse. If you are going to have surgery or dental work, tell your doctor or health care professional that you are taking this medicine. Certain genetic factors may reduce the effect of this medicine. Your doctor may use genetic tests to determine treatment. Only take aspirin if you are instructed to. Low doses of aspirin are used with this medicine to treat some conditions. Taking aspirin with this medicine can increase your risk of bleeding so you must be careful. Talk to your doctor or pharmacist if you have questions. What side effects may I notice from receiving this medicine? Side effects that you should report to your doctor or health care professional as soon as possible:  allergic reactions like skin rash, itching or hives, swelling of the face,  lips, or tongue  signs and symptoms of bleeding such as bloody or black, tarry stools; red or dark-brown urine; spitting up blood or brown material that looks like coffee grounds; red spots on the skin; unusual bruising or bleeding from the eye, gums, or nose  signs and symptoms of a blood clot such as breathing problems; changes in vision; chest pain; severe, sudden headache; pain, swelling, warmth in the leg; trouble speaking; sudden numbness or weakness of the face, arm or leg  signs and symptoms of low blood sugar such as feeling anxious; confusion; dizziness; increased hunger; unusually weak or tired; increased sweating; shakiness; cold, clammy skin; irritable; headache; blurred vision; fast heartbeat; loss of consciousness Side effects that usually do not require medical attention (report to your doctor or health care professional if they continue or are bothersome):  constipation  diarrhea  headache  upset stomach This list may not describe all possible side effects. Call your doctor for medical advice about side effects. You may report side effects to FDA at 1-800-FDA-1088. Where should I keep my medicine? Keep out of the reach of children. Store at room temperature of 59 to 86 degrees F (15 to 30 degrees C). Throw away any unused medicine after the expiration date. NOTE: This sheet is a summary. It may  not cover all possible information. If you have questions about this medicine, talk to your doctor, pharmacist, or health care provider.  2020 Elsevier/Gold Standard (2018-04-05 15:03:38)

## 2020-02-07 NOTE — Progress Notes (Signed)
Cardiology Office Note:    Date:  02/07/2020   ID:  Payeton Germani, DOB 06-18-1946, MRN [id number]  PCP:  Buckner Malta, MD  Cardiologist:  Garwin Brothers, MD   Referring MD: Buckner Malta, MD    ASSESSMENT:    1. Coronary artery disease involving native coronary artery of native heart without angina pectoris   2. Essential hypertension   3. Type 2 diabetes mellitus with hyperlipidemia (HCC)    PLAN:    In order of problems listed above:  1. Coronary artery disease: Secondary prevention stressed with the patient.  Importance of compliance with diet medication stressed.  She vocalized understanding.  She is doing well in the cardiac rehab program. 2. Essential hypertension: Blood pressure is stable 3. Mixed dyslipidemia: Lipids were reviewed with the patient and I am happy about those numbers. 4. She has Brilinta left for another 2 weeks or so subsequently she wants to go to generic medicine and we will switch her to Plavix.  We will advise her about the protocol.  This will be given to her in writing. 5. Patient will be seen in follow-up appointment in 6 months or earlier if the patient has any concerns    Medication Adjustments/Labs and Tests Ordered: Current medicines are reviewed at length with the patient today.  Concerns regarding medicines are outlined above.  No orders of the defined types were placed in this encounter.  No orders of the defined types were placed in this encounter.    Chief Complaint  Patient presents with  . Follow-up    2 Months     History of Present Illness:    Claudia Christian is a 74 y.o. female.  Patient has past medical history of coronary artery disease, essential hypertension dyslipidemia and diabetes mellitus.  She underwent coronary stenting.  She denies any problems at this time and takes care of activities of daily living.  No chest pain orthopnea or PND.  At the time of my evaluation, the patient is alert awake oriented  and in no distress.  She is enrolled in cardiac rehab without any issues.  Past Medical History:  Diagnosis Date  . Diabetes mellitus type 2, controlled (HCC)   . Essential hypertension   . Hyperlipidemia   . Nephrolithiasis     Past Surgical History:  Procedure Laterality Date  . CORONARY BALLOON ANGIOPLASTY N/A 12/02/2019   Procedure: CORONARY BALLOON ANGIOPLASTY;  Surgeon: Corky Crafts, MD;  Location: [facility] INVASIVE CV LAB;  Service: Cardiovascular;  Laterality: N/A;  . CORONARY STENT INTERVENTION N/A 12/02/2019   Procedure: CORONARY STENT INTERVENTION;  Surgeon: Corky Crafts, MD;  Location: [facility] INVASIVE CV LAB;  Service: Cardiovascular;  Laterality: N/A;  . INTRAVASCULAR ULTRASOUND/IVUS N/A 12/02/2019   Procedure: Intravascular Ultrasound/IVUS;  Surgeon: Corky Crafts, MD;  Location: [facility] INVASIVE CV LAB;  Service: Cardiovascular;  Laterality: N/A;  . IR NEPHROSTOMY EXCHANGE LEFT  12/28/2019  . IR NEPHROSTOMY PLACEMENT LEFT  11/16/2019  . LEFT HEART CATH AND CORONARY ANGIOGRAPHY N/A 12/02/2019   Procedure: LEFT HEART CATH AND CORONARY ANGIOGRAPHY;  Surgeon: Corky Crafts, MD;  Location: [facility] INVASIVE CV LAB;  Service: Cardiovascular;  Laterality: N/A;  . LITHOTRIPSY    . TUBAL LIGATION  1984    Current Medications: Current Meds  Medication Sig  . acetaminophen (TYLENOL) 325 MG tablet Take 2 tablets (650 mg total) by mouth every 6 (six) hours as needed for mild pain (or Fever >/= 101).  Marland Kitchen alendronate (FOSAMAX) 70 MG  tablet Take 70 mg by mouth every Thursday. Take with a full glass of water on an empty stomach.   Marland Kitchen aspirin EC 81 MG EC tablet Take 1 tablet (81 mg total) by mouth daily.  Marland Kitchen atorvastatin (LIPITOR) 40 MG tablet Take 1 tablet (40 mg total) by mouth daily. (Patient taking differently: Take 40 mg by mouth every evening. )  . Cholecalciferol (VITAMIN D-3) 125 MCG (5000 UT) TABS Take 5,000 Units by mouth daily.  . enalapril (VASOTEC) 20 MG tablet Take 20 mg  by mouth daily.  . ferrous sulfate 325 (65 FE) MG tablet Take 325 mg by mouth daily.  . fluticasone (FLONASE) 50 MCG/ACT nasal spray Place 1-2 sprays into both nostrils daily as needed for allergies or rhinitis.  . metFORMIN (GLUCOPHAGE) 1000 MG tablet Take 1,000 mg by mouth 2 (two) times daily with a meal.  . metoprolol tartrate (LOPRESSOR) 25 MG tablet Take 0.5 tablets (12.5 mg total) by mouth 2 (two) times daily.  . nitroGLYCERIN (NITROSTAT) 0.4 MG SL tablet Place 1 tablet (0.4 mg total) under the tongue every 5 (five) minutes as needed for chest pain (up to 3 doses. If taking 3rd dose call 911).  . polyethylene glycol (MIRALAX / GLYCOLAX) 17 g packet Take 17 g by mouth daily as needed for mild constipation.  . sertraline (ZOLOFT) 50 MG tablet Take 50 mg by mouth daily.  . sitaGLIPtin (JANUVIA) 25 MG tablet Take 25 mg by mouth every evening.   . ticagrelor (BRILINTA) 90 MG TABS tablet Take 1 tablet (90 mg total) by mouth 2 (two) times daily.  . vitamin B-12 (CYANOCOBALAMIN) 1000 MCG tablet Take 1,000 mcg by mouth daily.     Allergies:   Patient has no known allergies.   Social History   Socioeconomic History  . Marital status: Married    Spouse name: Not on file  . Number of children: Not on file  . Years of education: Not on file  . Highest education level: Not on file  Occupational History  . Not on file  Tobacco Use  . Smoking status: Never Smoker  . Smokeless tobacco: Never Used  Substance and Sexual Activity  . Alcohol use: Never  . Drug use: Never  . Sexual activity: Yes    Partners: Male  Other Topics Concern  . Not on file  Social History Narrative  . Not on file   Social Determinants of Health   Financial Resource Strain:   . Difficulty of Paying Living Expenses:   Food Insecurity:   . Worried About Charity fundraiser in the Last Year:   . Arboriculturist in the Last Year:   Transportation Needs:   . Film/video editor (Medical):   Marland Kitchen Lack of  Transportation (Non-Medical):   Physical Activity:   . Days of Exercise per Week:   . Minutes of Exercise per Session:   Stress:   . Feeling of Stress :   Social Connections:   . Frequency of Communication with Friends and Family:   . Frequency of Social Gatherings with Friends and Family:   . Attends Religious Services:   . Active Member of Clubs or Organizations:   . Attends Archivist Meetings:   Marland Kitchen Marital Status:      Family History: The patient's family history includes Heart attack in her brother, father, and mother.  ROS:   Please see the history of present illness.    All other systems reviewed and are  negative.  EKGs/Labs/Other Studies Reviewed:    The following studies were reviewed today: Corky Crafts, MD Study date: 12/02/19  MyChart Results Release  MyChart Status: Pending Results Release  Physicians  Panel Physicians Referring Physician Case Authorizing Physician  Corky Crafts, MD (Primary)    Procedures  CORONARY BALLOON ANGIOPLASTY  CORONARY STENT INTERVENTION  Intravascular Ultrasound/IVUS  LEFT HEART CATH AND CORONARY ANGIOGRAPHY  Conclusion    Mid LAD-2 lesion is 80% stenosed.  A drug-eluting stent was successfully placed using a SYNERGY XD 2.50X24.  Post intervention, there is a 0% residual stenosis.  Mid LAD-1 lesion is 70% stenosed.  A drug-eluting stent was successfully placed using a SYNERGY XD 2.75X8.  Post intervention, there is a 0% residual stenosis.  2nd Diag lesion is 75% stenosed.  Balloon angioplasty was performed using a BALLOON SAPPHIRE 2.25X15.  Post intervention, there is a 25% residual stenosis.  The left ventricular systolic function is normal.  LV end diastolic pressure is normal.  The left ventricular ejection fraction is 55-65% by visual estimate.  There is no aortic valve stenosis.   Complex, mid LAD bifurcation lesion.  Size differential from proximal to mid LAD due to large  diagonals originating from LAD.  Difficult to lay out mid LAD.  IVUS was helpful to show actual vessel size and guide postdilatation.       Recent Labs: 02/01/2020: ALT 12; BUN 18; Creatinine, Ser 0.97; Hemoglobin 11.4; Platelets 271; Potassium 4.5; Sodium 134; TSH 1.500  Recent Lipid Panel    Component Value Date/Time   CHOL 99 (L) 02/01/2020 0856   TRIG 151 (H) 02/01/2020 0856   HDL 32 (L) 02/01/2020 0856   CHOLHDL 3.1 02/01/2020 0856   LDLCALC 41 02/01/2020 0856    Physical Exam:    VS:  BP 114/68   Pulse (!) 56   Ht 5\' 4"  (1.626 m)   Wt 143 lb (64.9 kg)   BMI 24.55 kg/m     Wt Readings from Last 3 Encounters:  02/07/20 143 lb (64.9 kg)  12/09/19 146 lb (66.2 kg)  12/02/19 149 lb (67.6 kg)     GEN: Patient is in no acute distress HEENT: Normal NECK: No JVD; No carotid bruits LYMPHATICS: No lymphadenopathy CARDIAC: Hear sounds regular, 2/6 systolic murmur at the apex. RESPIRATORY:  Clear to auscultation without rales, wheezing or rhonchi  ABDOMEN: Soft, non-tender, non-distended MUSCULOSKELETAL:  No edema; No deformity  SKIN: Warm and dry NEUROLOGIC:  Alert and oriented x 3 PSYCHIATRIC:  Normal affect   Signed, 12/04/19, MD  02/07/2020 2:26 PM    [facility]

## 2020-02-08 DIAGNOSIS — Z955 Presence of coronary angioplasty implant and graft: Secondary | ICD-10-CM | POA: Diagnosis not present

## 2020-02-09 ENCOUNTER — Ambulatory Visit (HOSPITAL_COMMUNITY)
Admission: RE | Admit: 2020-02-09 | Discharge: 2020-02-09 | Disposition: A | Discharge status: Home / Self Care | Payer: Medicare PPO | Source: Ambulatory Visit | Attending: Urology | Admitting: Urology

## 2020-02-09 ENCOUNTER — Other Ambulatory Visit: Payer: Self-pay

## 2020-02-09 ENCOUNTER — Other Ambulatory Visit (HOSPITAL_COMMUNITY): Payer: Self-pay | Admitting: Urology

## 2020-02-09 DIAGNOSIS — Z436 Encounter for attention to other artificial openings of urinary tract: Secondary | ICD-10-CM | POA: Insufficient documentation

## 2020-02-09 DIAGNOSIS — N202 Calculus of kidney with calculus of ureter: Secondary | ICD-10-CM | POA: Diagnosis not present

## 2020-02-09 HISTORY — PX: IR NEPHROSTOMY EXCHANGE LEFT: IMG6069

## 2020-02-09 MED ORDER — IOHEXOL 300 MG/ML  SOLN
50.0000 mL | Freq: Once | INTRAMUSCULAR | Status: AC | PRN
  Administered 2020-02-09: 10 mL

## 2020-02-09 MED ORDER — LIDOCAINE HCL 1 % IJ SOLN
INTRAMUSCULAR | Status: AC
  Filled 2020-02-09: qty 20

## 2020-02-09 NOTE — Procedures (Signed)
Pre Procedure Dx: Hydronephrosis Post Procedure Dx: Same  Successful left sided PCN exchange.    EBL: None   No immediate complications.   Jay Saidee Geremia, MD Pager #: [phone]   

## 2020-02-10 DIAGNOSIS — Z955 Presence of coronary angioplasty implant and graft: Secondary | ICD-10-CM | POA: Diagnosis not present

## 2020-02-13 DIAGNOSIS — Z955 Presence of coronary angioplasty implant and graft: Secondary | ICD-10-CM | POA: Diagnosis not present

## 2020-02-14 DIAGNOSIS — Z955 Presence of coronary angioplasty implant and graft: Secondary | ICD-10-CM | POA: Diagnosis not present

## 2020-02-15 DIAGNOSIS — Z955 Presence of coronary angioplasty implant and graft: Secondary | ICD-10-CM | POA: Diagnosis not present

## 2020-02-20 DIAGNOSIS — Z95820 Peripheral vascular angioplasty status with implants and grafts: Secondary | ICD-10-CM | POA: Diagnosis not present

## 2020-02-20 DIAGNOSIS — Z955 Presence of coronary angioplasty implant and graft: Secondary | ICD-10-CM | POA: Diagnosis not present

## 2020-02-22 DIAGNOSIS — E538 Deficiency of other specified B group vitamins: Secondary | ICD-10-CM | POA: Diagnosis not present

## 2020-02-22 DIAGNOSIS — Z95820 Peripheral vascular angioplasty status with implants and grafts: Secondary | ICD-10-CM | POA: Diagnosis not present

## 2020-02-22 DIAGNOSIS — Z955 Presence of coronary angioplasty implant and graft: Secondary | ICD-10-CM | POA: Diagnosis not present

## 2020-02-22 DIAGNOSIS — E1121 Type 2 diabetes mellitus with diabetic nephropathy: Secondary | ICD-10-CM | POA: Diagnosis not present

## 2020-02-24 DIAGNOSIS — Z95820 Peripheral vascular angioplasty status with implants and grafts: Secondary | ICD-10-CM | POA: Diagnosis not present

## 2020-02-24 DIAGNOSIS — Z955 Presence of coronary angioplasty implant and graft: Secondary | ICD-10-CM | POA: Diagnosis not present

## 2020-02-27 DIAGNOSIS — Z955 Presence of coronary angioplasty implant and graft: Secondary | ICD-10-CM | POA: Diagnosis not present

## 2020-02-27 DIAGNOSIS — Z95820 Peripheral vascular angioplasty status with implants and grafts: Secondary | ICD-10-CM | POA: Diagnosis not present

## 2020-02-29 DIAGNOSIS — Z955 Presence of coronary angioplasty implant and graft: Secondary | ICD-10-CM | POA: Diagnosis not present

## 2020-02-29 DIAGNOSIS — Z95820 Peripheral vascular angioplasty status with implants and grafts: Secondary | ICD-10-CM | POA: Diagnosis not present

## 2020-03-01 DIAGNOSIS — I25119 Atherosclerotic heart disease of native coronary artery with unspecified angina pectoris: Secondary | ICD-10-CM | POA: Diagnosis not present

## 2020-03-01 DIAGNOSIS — N200 Calculus of kidney: Secondary | ICD-10-CM | POA: Diagnosis not present

## 2020-03-01 DIAGNOSIS — Z6826 Body mass index (BMI) 26.0-26.9, adult: Secondary | ICD-10-CM | POA: Diagnosis not present

## 2020-03-01 DIAGNOSIS — N132 Hydronephrosis with renal and ureteral calculous obstruction: Secondary | ICD-10-CM | POA: Diagnosis not present

## 2020-03-01 DIAGNOSIS — F324 Major depressive disorder, single episode, in partial remission: Secondary | ICD-10-CM | POA: Diagnosis not present

## 2020-03-01 DIAGNOSIS — E1121 Type 2 diabetes mellitus with diabetic nephropathy: Secondary | ICD-10-CM | POA: Diagnosis not present

## 2020-03-01 DIAGNOSIS — Z955 Presence of coronary angioplasty implant and graft: Secondary | ICD-10-CM | POA: Diagnosis not present

## 2020-03-02 DIAGNOSIS — Z955 Presence of coronary angioplasty implant and graft: Secondary | ICD-10-CM | POA: Diagnosis not present

## 2020-03-02 DIAGNOSIS — Z95820 Peripheral vascular angioplasty status with implants and grafts: Secondary | ICD-10-CM | POA: Diagnosis not present

## 2020-03-05 DIAGNOSIS — Z95820 Peripheral vascular angioplasty status with implants and grafts: Secondary | ICD-10-CM | POA: Diagnosis not present

## 2020-03-05 DIAGNOSIS — Z955 Presence of coronary angioplasty implant and graft: Secondary | ICD-10-CM | POA: Diagnosis not present

## 2020-03-07 DIAGNOSIS — Z955 Presence of coronary angioplasty implant and graft: Secondary | ICD-10-CM | POA: Diagnosis not present

## 2020-03-07 DIAGNOSIS — Z95820 Peripheral vascular angioplasty status with implants and grafts: Secondary | ICD-10-CM | POA: Diagnosis not present

## 2020-03-09 DIAGNOSIS — Z955 Presence of coronary angioplasty implant and graft: Secondary | ICD-10-CM | POA: Diagnosis not present

## 2020-03-09 DIAGNOSIS — Z95820 Peripheral vascular angioplasty status with implants and grafts: Secondary | ICD-10-CM | POA: Diagnosis not present

## 2020-03-12 DIAGNOSIS — Z955 Presence of coronary angioplasty implant and graft: Secondary | ICD-10-CM | POA: Diagnosis not present

## 2020-03-12 DIAGNOSIS — Z95820 Peripheral vascular angioplasty status with implants and grafts: Secondary | ICD-10-CM | POA: Diagnosis not present

## 2020-03-14 DIAGNOSIS — Z95820 Peripheral vascular angioplasty status with implants and grafts: Secondary | ICD-10-CM | POA: Diagnosis not present

## 2020-03-14 DIAGNOSIS — Z955 Presence of coronary angioplasty implant and graft: Secondary | ICD-10-CM | POA: Diagnosis not present

## 2020-03-15 ENCOUNTER — Other Ambulatory Visit: Payer: Self-pay | Admitting: Physician Assistant

## 2020-03-22 ENCOUNTER — Ambulatory Visit (HOSPITAL_COMMUNITY)
Admission: RE | Admit: 2020-03-22 | Discharge: 2020-03-22 | Disposition: A | Discharge status: Home / Self Care | Payer: Medicare PPO | Source: Ambulatory Visit | Attending: Urology | Admitting: Urology

## 2020-03-22 ENCOUNTER — Other Ambulatory Visit (HOSPITAL_COMMUNITY): Payer: Self-pay | Admitting: Urology

## 2020-03-22 ENCOUNTER — Other Ambulatory Visit: Payer: Self-pay

## 2020-03-22 DIAGNOSIS — N135 Crossing vessel and stricture of ureter without hydronephrosis: Secondary | ICD-10-CM

## 2020-03-22 DIAGNOSIS — Z436 Encounter for attention to other artificial openings of urinary tract: Secondary | ICD-10-CM | POA: Diagnosis not present

## 2020-03-22 DIAGNOSIS — N202 Calculus of kidney with calculus of ureter: Secondary | ICD-10-CM | POA: Insufficient documentation

## 2020-03-22 DIAGNOSIS — N201 Calculus of ureter: Secondary | ICD-10-CM | POA: Diagnosis not present

## 2020-03-22 HISTORY — PX: IR NEPHROSTOMY EXCHANGE LEFT: IMG6069

## 2020-03-22 MED ORDER — IOHEXOL 300 MG/ML  SOLN
50.0000 mL | Freq: Once | INTRAMUSCULAR | Status: AC | PRN
  Administered 2020-03-22: 14:00:00 5 mL

## 2020-03-22 MED ORDER — LIDOCAINE HCL 1 % IJ SOLN
INTRAMUSCULAR | Status: AC
  Filled 2020-03-22: qty 20

## 2020-04-09 DIAGNOSIS — N202 Calculus of kidney with calculus of ureter: Secondary | ICD-10-CM | POA: Diagnosis not present

## 2020-04-10 ENCOUNTER — Other Ambulatory Visit: Payer: Self-pay | Admitting: Urology

## 2020-04-10 ENCOUNTER — Telehealth: Payer: Self-pay | Admitting: Cardiology

## 2020-04-10 NOTE — Telephone Encounter (Signed)
   [facility] Pre-operative Risk Assessment    HEARTCARE STAFF: - Please ensure there is not already an duplicate clearance open for this procedure. - Under Visit Info/Reason for Call, type in Other and utilize the format Clearance MM/DD/YY or Clearance TBD. Do not use dashes or single digits. - If request is for dental extraction, please clarify the # of teeth to be extracted.  Request for surgical clearance:  1. What type of surgery is being performed? Percutaneous Nephrolithotomy   2. When is this surgery scheduled? Pending   3. What type of clearance is required (medical clearance vs. Pharmacy clearance to hold med vs. Both)?medicine and medical  4. Are there any medications that need to be held prior to surgery and how long? Can pt stop her  Plavix and aspirin- when in July can pt have her surgery after stetn that was put in January   5. Practice name and name of physician performing surgery? Dr Alexis Frock  6. What is the office phone number?[phone]  7.   7.   What is the office fax number?  [phone]  8.   Anesthesia type (None, local, MAC, general) general   Glyn Ade 04/10/2020, 1:43 PM  _________________________________________________________________   (provider comments below)  .

## 2020-04-17 NOTE — Telephone Encounter (Signed)
I would give it at least 6 months and then evaluate her and decide.  At best I would hold 1 medicine not both.

## 2020-04-17 NOTE — Telephone Encounter (Signed)
Dr. Tomie China to review. Patient received 2 DES to mid LAD and a balloon angioplasty to 2nd diagonal in Jan. Dr. Tomie China, please clarify what would be the earliest date to hold aspirin and plavix prior to Nephrolithotomy?

## 2020-04-18 NOTE — Telephone Encounter (Signed)
   Primary Cardiologist:Rajan R Revankar, MD  Chart reviewed as part of pre-operative protocol coverage. Because of Claudia Christian's past medical history and time since last visit, they will require a follow-up visit in order to better assess preoperative cardiovascular risk.  Pre-op covering staff: - Please schedule appointment and call patient to inform them.  - Please contact requesting surgeon's office via preferred method (i.e, phone, fax) to inform them of need for appointment prior to surgery.  Patient's surgery is scheduled on 7/28. Please arrange followup with Claudia Christian in July on or before 7/21 so he can reevaluate the patient decide whether patient can come off of antiplatelet medication.   [place], [place]  04/18/2020, 8:54 AM

## 2020-04-18 NOTE — Telephone Encounter (Signed)
1st attempt to call patient and schedule appointment for preop clearance. LVM

## 2020-04-18 NOTE — Telephone Encounter (Signed)
Patient has appointment to see Dr. Tomie China on 05/01/20

## 2020-04-30 ENCOUNTER — Telehealth: Payer: Self-pay

## 2020-04-30 NOTE — Telephone Encounter (Signed)
Called pt to notify her that it would be high risk for her to be taken off of her blood thinner at this time as her stent was 115/21. Pt states that she will be 6 months out when her surgery is scheduled. Pt has an appointment on 05/01/20 for preop. Pt states that she thinks they want her off of both medications for the procedure.

## 2020-05-01 ENCOUNTER — Ambulatory Visit: Payer: Medicare PPO | Admitting: Cardiology

## 2020-05-01 ENCOUNTER — Other Ambulatory Visit: Payer: Self-pay

## 2020-05-01 ENCOUNTER — Encounter: Payer: Self-pay | Admitting: Cardiology

## 2020-05-01 VITALS — BP 120/62 | HR 54 | Ht 64.0 in | Wt 139.0 lb

## 2020-05-01 DIAGNOSIS — I2510 Atherosclerotic heart disease of native coronary artery without angina pectoris: Secondary | ICD-10-CM | POA: Diagnosis not present

## 2020-05-01 DIAGNOSIS — N132 Hydronephrosis with renal and ureteral calculous obstruction: Secondary | ICD-10-CM | POA: Diagnosis not present

## 2020-05-01 DIAGNOSIS — E1169 Type 2 diabetes mellitus with other specified complication: Secondary | ICD-10-CM | POA: Diagnosis not present

## 2020-05-01 DIAGNOSIS — E785 Hyperlipidemia, unspecified: Secondary | ICD-10-CM | POA: Diagnosis not present

## 2020-05-01 DIAGNOSIS — I10 Essential (primary) hypertension: Secondary | ICD-10-CM | POA: Diagnosis not present

## 2020-05-01 NOTE — Progress Notes (Signed)
Cardiology Office Note:    Date:  05/01/2020   ID:  Waldon Merl, DOB 1946-01-23, MRN [id number]  PCP:  Serita Grammes, MD  Cardiologist:  Jenean Lindau, MD   Referring MD: Serita Grammes, MD    ASSESSMENT:    1. Coronary artery disease involving native coronary artery of native heart without angina pectoris   2. Essential hypertension   3. Type 2 diabetes mellitus with hyperlipidemia ([place])   4. Hydronephrosis with renal and ureteral calculus obstruction    PLAN:    In order of problems listed above:  1. Coronary artery disease: Secondary prevention stressed with the patient.  Importance of compliance with diet medication stressed and she vocalized understanding.  She walks 30 minutes a day without any problems and that is excellent 2. Initial hypertension: Blood pressure is stable 3. Mixed dyslipidemia: Diet was emphasized and she vocalized understanding her lipids are fine from March 4. Preoperative evaluation: She has procedure scheduled for July 28 and her urologist wants her to stop her aspirin and Plavix.  I will check with interventional cardiologist who did her procedure.  I wonder whether it would be okay to stop the Plavix and keep her on aspirin.  I am not sure about stopping or interrupting both antiplatelet agents but I will most certainly await input from Dr. Irish Lack whom I have messaged.  We will communicate this with the patient and her urologist.   Medication Adjustments/Labs and Tests Ordered: Current medicines are reviewed at length with the patient today.  Concerns regarding medicines are outlined above.  No orders of the defined types were placed in this encounter.  No orders of the defined types were placed in this encounter.    Chief Complaint  Patient presents with  . Pre-op Exam     History of Present Illness:    Claudia Christian is a 74 y.o. female.  Patient has past medical history of coronary disease, essential hypertension  dyslipidemia and diabetes mellitus.  She is doing fine.  She walks 30 minutes a day without any problems.  No chest pain orthopnea or PND.  At the time of my evaluation, the patient is alert awake oriented and in no distress.  She is planning to undergo kidney stone removal on July 28 and wanted some guidance about dual antiplatelet therapy.  Past Medical History:  Diagnosis Date  . Diabetes mellitus type 2, controlled ([place])   . Essential hypertension   . Hyperlipidemia   . Nephrolithiasis     Past Surgical History:  Procedure Laterality Date  . CORONARY BALLOON ANGIOPLASTY N/A 12/02/2019   Procedure: CORONARY BALLOON ANGIOPLASTY;  Surgeon: Jettie Booze, MD;  Location: [facility];  Service: Cardiovascular;  Laterality: N/A;  . CORONARY STENT INTERVENTION N/A 12/02/2019   Procedure: CORONARY STENT INTERVENTION;  Surgeon: Jettie Booze, MD;  Location: [facility];  Service: Cardiovascular;  Laterality: N/A;  . INTRAVASCULAR ULTRASOUND/IVUS N/A 12/02/2019   Procedure: Intravascular Ultrasound/IVUS;  Surgeon: Jettie Booze, MD;  Location: [facility];  Service: Cardiovascular;  Laterality: N/A;  . IR NEPHROSTOMY EXCHANGE LEFT  12/28/2019  . IR NEPHROSTOMY EXCHANGE LEFT  02/09/2020  . IR NEPHROSTOMY EXCHANGE LEFT  03/22/2020  . IR NEPHROSTOMY PLACEMENT LEFT  11/16/2019  . LEFT HEART CATH AND CORONARY ANGIOGRAPHY N/A 12/02/2019   Procedure: LEFT HEART CATH AND CORONARY ANGIOGRAPHY;  Surgeon: Jettie Booze, MD;  Location: [facility];  Service: Cardiovascular;  Laterality: N/A;  . LITHOTRIPSY    .  TUBAL LIGATION  1984    Current Medications: Current Meds  Medication Sig  . acetaminophen (TYLENOL) 325 MG tablet Take 2 tablets (650 mg total) by mouth every 6 (six) hours as needed for mild pain (or Fever >/= 101).  Marland Kitchen alendronate (FOSAMAX) 70 MG tablet Take 70 mg by mouth every Thursday. Take with a full glass of water on an empty stomach.   Marland Kitchen  aspirin EC 81 MG EC tablet Take 1 tablet (81 mg total) by mouth daily.  Marland Kitchen atorvastatin (LIPITOR) 40 MG tablet Take 1 tablet (40 mg total) by mouth daily. (Patient taking differently: Take 40 mg by mouth every evening. )  . Cholecalciferol (VITAMIN D-3) 125 MCG (5000 UT) TABS Take 5,000 Units by mouth daily.  . clopidogrel (PLAVIX) 75 MG tablet Take 1 tablet (75 mg total) by mouth daily. Start taking 24 hours after 300 mg Plavix.  Marland Kitchen enalapril (VASOTEC) 20 MG tablet Take 20 mg by mouth daily.  . ferrous sulfate 325 (65 FE) MG tablet Take 325 mg by mouth daily.  . fluticasone (FLONASE) 50 MCG/ACT nasal spray Place 1-2 sprays into both nostrils daily as needed for allergies or rhinitis.  . metFORMIN (GLUCOPHAGE) 1000 MG tablet Take 1,000 mg by mouth 2 (two) times daily with a meal.  . metoprolol tartrate (LOPRESSOR) 25 MG tablet TAKE 1/2 TABLET BY MOUTH TWICE DAILY  . nitroGLYCERIN (NITROSTAT) 0.4 MG SL tablet Place 1 tablet (0.4 mg total) under the tongue every 5 (five) minutes as needed for chest pain (up to 3 doses. If taking 3rd dose call 911).  . polyethylene glycol (MIRALAX / GLYCOLAX) 17 g packet Take 17 g by mouth daily as needed for mild constipation.  . sertraline (ZOLOFT) 50 MG tablet Take 50 mg by mouth daily.  . sitaGLIPtin (JANUVIA) 25 MG tablet Take 25 mg by mouth every evening.   . vitamin B-12 (CYANOCOBALAMIN) 1000 MCG tablet Take 1,000 mcg by mouth daily.     Allergies:   Patient has no known allergies.   Social History   Socioeconomic History  . Marital status: Married    Spouse name: Not on file  . Number of children: Not on file  . Years of education: Not on file  . Highest education level: Not on file  Occupational History  . Not on file  Tobacco Use  . Smoking status: Never Smoker  . Smokeless tobacco: Never Used  Substance and Sexual Activity  . Alcohol use: Never  . Drug use: Never  . Sexual activity: Yes    Partners: Male  Other Topics Concern  . Not on file   Social History Narrative  . Not on file   Social Determinants of Health   Financial Resource Strain:   . Difficulty of Paying Living Expenses:   Food Insecurity:   . Worried About Programme researcher, broadcasting/film/video in the Last Year:   . Barista in the Last Year:   Transportation Needs:   . Freight forwarder (Medical):   Marland Kitchen Lack of Transportation (Non-Medical):   Physical Activity:   . Days of Exercise per Week:   . Minutes of Exercise per Session:   Stress:   . Feeling of Stress :   Social Connections:   . Frequency of Communication with Friends and Family:   . Frequency of Social Gatherings with Friends and Family:   . Attends Religious Services:   . Active Member of Clubs or Organizations:   . Attends Club or  Organization Meetings:   Marland Kitchen Marital Status:      Family History: The patient's family history includes Heart attack in her brother, father, and mother.  ROS:   Please see the history of present illness.    All other systems reviewed and are negative.  EKGs/Labs/Other Studies Reviewed:    The following studies were reviewed today: I discussed my findings with the patient at length   Recent Labs: 02/01/2020: ALT 12; BUN 18; Creatinine, Ser 0.97; Hemoglobin 11.4; Platelets 271; Potassium 4.5; Sodium 134; TSH 1.500  Recent Lipid Panel    Component Value Date/Time   CHOL 99 (L) 02/01/2020 0856   TRIG 151 (H) 02/01/2020 0856   HDL 32 (L) 02/01/2020 0856   CHOLHDL 3.1 02/01/2020 0856   LDLCALC 41 02/01/2020 0856    Physical Exam:    VS:  BP 120/62   Pulse (!) 54   Ht 5\' 4"  (1.626 m)   Wt 139 lb (63 kg)   SpO2 98%   BMI 23.86 kg/m     Wt Readings from Last 3 Encounters:  05/01/20 139 lb (63 kg)  02/07/20 143 lb (64.9 kg)  12/09/19 146 lb (66.2 kg)     GEN: Patient is in no acute distress HEENT: Normal NECK: No JVD; No carotid bruits LYMPHATICS: No lymphadenopathy CARDIAC: Hear sounds regular, 2/6 systolic murmur at the apex. RESPIRATORY:  Clear to  auscultation without rales, wheezing or rhonchi  ABDOMEN: Soft, non-tender, non-distended MUSCULOSKELETAL:  No edema; No deformity  SKIN: Warm and dry NEUROLOGIC:  Alert and oriented x 3 PSYCHIATRIC:  Normal affect   Signed, 12/11/19, MD  05/01/2020 8:19 AM    [facility]

## 2020-05-01 NOTE — Patient Instructions (Signed)

## 2020-05-03 ENCOUNTER — Ambulatory Visit (HOSPITAL_COMMUNITY)
Admission: RE | Admit: 2020-05-03 | Discharge: 2020-05-03 | Disposition: A | Discharge status: Home / Self Care | Payer: Medicare PPO | Source: Ambulatory Visit | Attending: Urology | Admitting: Urology

## 2020-05-03 ENCOUNTER — Other Ambulatory Visit: Payer: Self-pay

## 2020-05-03 DIAGNOSIS — Z436 Encounter for attention to other artificial openings of urinary tract: Secondary | ICD-10-CM | POA: Insufficient documentation

## 2020-05-03 DIAGNOSIS — N135 Crossing vessel and stricture of ureter without hydronephrosis: Secondary | ICD-10-CM | POA: Insufficient documentation

## 2020-05-03 HISTORY — PX: IR NEPHROSTOMY EXCHANGE LEFT: IMG6069

## 2020-05-03 MED ORDER — IOHEXOL 300 MG/ML  SOLN
50.0000 mL | Freq: Once | INTRAMUSCULAR | Status: AC | PRN
  Administered 2020-05-03: 5 mL

## 2020-05-24 NOTE — Patient Instructions (Addendum)
DUE TO COVID-19 ONLY ONE VISITOR IS ALLOWED TO COME WITH YOU AND STAY IN THE WAITING ROOM ONLY DURING PRE OP AND PROCEDURE DAY OF SURGERY. THE 2 VISITORS  MAY VISIT WITH YOU AFTER SURGERY IN YOUR PRIVATE ROOM DURING VISITING HOURS ONLY!  YOU NEED TO HAVE A COVID 19 TEST ON__7/24_____ @_11 :45______, THIS TEST MUST BE DONE BEFORE SURGERY, COME  [address], [place] , .  [facility]) ONCE YOUR COVID TEST IS COMPLETED, PLEASE BEGIN THE QUARANTINE INSTRUCTIONS AS OUTLINED IN YOUR HANDOUT.                Claudia Christian    Your procedure is scheduled on: 06/13/20   Report to [facility] Main  Entrance   Report to admitting at  6:30 AM     Call this number if you have problems the morning of surgery [phone]    Remember: Do not eat food or drink liquids :After Midnight.   BRUSH YOUR TEETH MORNING OF SURGERY AND RINSE YOUR MOUTH OUT, NO CHEWING GUM CANDY OR MINTS.     Take these medicines the morning of surgery with A SIP OF WATER: Zoloft, Metoprolol  DO NOT TAKE ANY DIABETIC MEDICATIONS DAY OF YOUR SURGERY                         How to Manage Your Diabetes Before and After Surgery  Why is it important to control my blood sugar before and after surgery? . Improving blood sugar levels before and after surgery helps healing and can limit problems. . A way of improving blood sugar control is eating a healthy diet by: o  Eating less sugar and carbohydrates o  Increasing activity/exercise o  Talking with your doctor about reaching your blood sugar goals . High blood sugars (greater than 180 mg/dL) can raise your risk of infections and slow your recovery, so you will need to focus on controlling your diabetes during the weeks before surgery. . Make sure that the doctor who takes care of your diabetes knows about your planned surgery including the date and location.  How do I manage my blood sugar before surgery? . Check your blood sugar at least  4 times a day, starting 2 days before surgery, to make sure that the level is not too high or low. o Check your blood sugar the morning of your surgery when you wake up and every 2 hours until you get to the Short Stay unit. . If your blood sugar is less than 70 mg/dL, you will need to treat for low blood sugar: o Do not take insulin. o Treat a low blood sugar (less than 70 mg/dL) with  cup of clear juice (cranberry or apple), 4 glucose tablets, OR glucose gel. o Recheck blood sugar in 15 minutes after treatment (to make sure it is greater than 70 mg/dL). If your blood sugar is not greater than 70 mg/dL on recheck, call [phone] for further instructions. . Report your blood sugar to the short stay nurse when you get to Short Stay.  . If you are admitted to the hospital after surgery: o Your blood sugar will be checked by the staff and you will probably be given insulin after surgery (instead of oral diabetes medicines) to make sure you have good blood sugar levels. o The goal for blood sugar control after surgery is 80-180 mg/dL.   WHAT DO I DO ABOUT MY DIABETES MEDICATION?  [phone]  Do not take oral diabetes medicines (pills) the morning of surgery.        You may not have any metal on your body including hair pins and              piercings  Do not wear jewelry, make-up, lotions, powders or perfumes, deodorant             Do not wear nail polish on your fingernails.  Do not shave  48 hours prior to surgery.              Do not bring valuables to the hospital. [place] [facility] IS NOT             RESPONSIBLE   FOR VALUABLES.  Contacts, dentures or bridgework may not be worn into surgery.        Special Instructions: N/A              Please read over the following fact sheets you were given: _____________________________________________________________________             [facility] - Preparing for Surgery Before surgery, you can play an important role.   Because skin is not sterile,  your skin needs to be as free of germs as possible.   You can reduce the number of germs on your skin by washing with CHG (chlorahexidine gluconate) soap before surgery .  CHG is an antiseptic cleaner which kills germs and bonds with the skin to continue killing germs even after washing. Please DO NOT use if you have an allergy to CHG or antibacterial soaps.   If your skin becomes reddened/irritated stop using the CHG and inform your nurse when you arrive at Short Stay. Do not shave (including legs and underarms) for at least 48 hours prior to the first CHG shower  Please follow these instructions carefully:  1.  Shower with CHG Soap the night before surgery and the  morning of Surgery.  2.  If you choose to wash your hair, wash your hair first as usual with your  normal  shampoo.  3.  After you shampoo, rinse your hair and body thoroughly to remove the  shampoo.                                        4.  Use CHG as you would any other liquid soap.  You can apply chg directly  to the skin and wash                       Gently with a scrungie or clean washcloth.  5.  Apply the CHG Soap to your body ONLY FROM THE NECK DOWN.   Do not use on face/ open                           Wound or open sores. Avoid contact with eyes, ears mouth and genitals (private parts).                       Wash face,  Genitals (private parts) with your normal soap.             6.  Wash thoroughly, paying special attention to the area where your surgery  will be performed.  7.  Thoroughly rinse your body with warm water  from the neck down.  8.  DO NOT shower/wash with your normal soap after using and rinsing off  the CHG Soap.             9.  Pat yourself dry with a clean towel.            10.  Wear clean pajamas.            11.  Place clean sheets on your bed the night of your first shower and do not  sleep with pets. Day of Surgery : Do not apply any lotions/deodorants the morning of surgery.  Please wear clean  clothes to the hospital/surgery center.  FAILURE TO FOLLOW THESE INSTRUCTIONS MAY RESULT IN THE CANCELLATION OF YOUR SURGERY PATIENT SIGNATURE_________________________________  NURSE SIGNATURE__________________________________  ________________________________________________________________________

## 2020-05-29 ENCOUNTER — Telehealth: Payer: Self-pay | Admitting: Cardiology

## 2020-05-29 DIAGNOSIS — R8271 Bacteriuria: Secondary | ICD-10-CM | POA: Diagnosis not present

## 2020-05-29 DIAGNOSIS — N202 Calculus of kidney with calculus of ureter: Secondary | ICD-10-CM | POA: Diagnosis not present

## 2020-05-29 NOTE — Telephone Encounter (Signed)
New Message:    Pt says she is scheduled for gallbladder surgery in July. She says does Dr Tomie China need to see her before her surgery?

## 2020-05-29 NOTE — Telephone Encounter (Signed)
Left message for pt to callback regarding clearance for gallbladder surgery.

## 2020-05-30 NOTE — Telephone Encounter (Signed)
Spoke with pt and explained that we have not received a clearance paper from her surgeon and once it we receive it the form will be reviewed and determined at that time if a visit is needed.

## 2020-06-04 ENCOUNTER — Other Ambulatory Visit: Payer: Self-pay

## 2020-06-04 ENCOUNTER — Encounter (HOSPITAL_COMMUNITY): Payer: Self-pay

## 2020-06-04 ENCOUNTER — Encounter (HOSPITAL_COMMUNITY)
Admission: RE | Admit: 2020-06-04 | Discharge: 2020-06-04 | Disposition: A | Discharge status: Home / Self Care | Payer: Medicare PPO | Source: Ambulatory Visit | Attending: Urology | Admitting: Urology

## 2020-06-04 DIAGNOSIS — Z01812 Encounter for preprocedural laboratory examination: Secondary | ICD-10-CM | POA: Insufficient documentation

## 2020-06-04 DIAGNOSIS — E1151 Type 2 diabetes mellitus with diabetic peripheral angiopathy without gangrene: Secondary | ICD-10-CM | POA: Diagnosis not present

## 2020-06-04 HISTORY — DX: Atherosclerotic heart disease of native coronary artery without angina pectoris: I25.10

## 2020-06-04 HISTORY — DX: Personal history of urinary calculi: Z87.442

## 2020-06-04 LAB — CBC
HCT: 35.2 % — ABNORMAL LOW (ref 36.0–46.0)
Hemoglobin: 11.1 g/dL — ABNORMAL LOW (ref 12.0–15.0)
MCH: 28.2 pg (ref 26.0–34.0)
MCHC: 31.5 g/dL (ref 30.0–36.0)
MCV: 89.6 fL (ref 80.0–100.0)
Platelets: 267 10*3/uL (ref 150–400)
RBC: 3.93 MIL/uL (ref 3.87–5.11)
RDW: 13.2 % (ref 11.5–15.5)
WBC: 7.5 10*3/uL (ref 4.0–10.5)
nRBC: 0 % (ref 0.0–0.2)

## 2020-06-04 LAB — BASIC METABOLIC PANEL
Anion gap: 9 (ref 5–15)
BUN: 16 mg/dL (ref 8–23)
CO2: 26 mmol/L (ref 22–32)
Calcium: 9.7 mg/dL (ref 8.9–10.3)
Chloride: 101 mmol/L (ref 98–111)
Creatinine, Ser: 0.78 mg/dL (ref 0.44–1.00)
GFR calc Af Amer: >60 mL/min (ref >60)
GFR calc non Af Amer: >60 mL/min (ref >60)
Glucose, Bld: 116 mg/dL — ABNORMAL HIGH (ref 70–99)
Potassium: 4.7 mmol/L (ref 3.5–5.1)
Sodium: 136 mmol/L (ref 135–145)

## 2020-06-04 LAB — GLUCOSE, CAPILLARY: Glucose-Capillary: 105 mg/dL — ABNORMAL HIGH (ref 70–99)

## 2020-06-04 LAB — HEMOGLOBIN A1C
Hgb A1c MFr Bld: 6.4 % — ABNORMAL HIGH (ref 4.8–5.6)
Mean Plasma Glucose: 136.98 mg/dL

## 2020-06-04 NOTE — Progress Notes (Signed)
COVID Vaccine Completed:Yes Date COVID Vaccine completed:03/21/20 COVID vaccine manufacturer: Pfizer      PCP - Dr. Orlinda Blalock Cardiologist - Dr. Elvera Lennox. Revank[facility]  Chest x-ray - 11/28/19 EKG - 12/02/19 Stress Test - no ECHO - 11/12/20 Cardiac Cath - 12/02/19  Sleep Study - no CPAP -   Fasting Blood Sugar - 98-112 Checks Blood Sugar _____ times a day 2-3 times a week  Blood Thinner Instructions:Plavix and ASA Aspirin Instructions:continue. Plavix should be held for 7 days prior to DOS/Dr. Tomie China Last Dose:06/06/20  Anesthesia review:   Patient denies shortness of breath, fever, cough and chest pain at PAT appointment  yes   Patient verbalized understanding of instructions that were given to them at the PAT appointment. Patient was also instructed that they will need to review over the PAT instructions again at home before surgery. Yes  Pt walk 5 days a week for 30 min. She has no SOB with ADLs or climbing stairs

## 2020-06-05 NOTE — Telephone Encounter (Signed)
Claudia Christian, This is Risk analyst. Kindly give me your input about this pt and DAPT in view of coronary intervention in the past. The pt is wanting surgery be to be done urgently for reasons that I have mentioned in my note.  Thank you

## 2020-06-05 NOTE — Telephone Encounter (Signed)
Pt's procedure is scheduled for 7/28.  Do we have a recommendation for holding aspirin and Plavix ?Marland Kitchen  Corine Shelter PA-C 06/05/2020 9:44 AM

## 2020-06-06 NOTE — Telephone Encounter (Signed)
It has been 6 months since PCI.  I would prefer that she stay on aspirin alone during surgery.  If this is not possible due to bleeding risk during surgery, then she can stop both, aspirin and plavix, but would restart antiplatelet therapy ASAP post op, when safe from a bleeding standpoint.   JV

## 2020-06-06 NOTE — Telephone Encounter (Signed)
Selita from [facility] was calling to follow up on this clearance request. The patient has been scheduled for her procedure 06/13/20. Please confirm clearance

## 2020-06-07 NOTE — Anesthesia Preprocedure Evaluation (Addendum)
Anesthesia Evaluation  Patient identified by MRN, date of birth, ID band Patient awake    Reviewed: Allergy & Precautions, NPO status , Patient's Chart, lab work & pertinent test results  Airway Mallampati: II  TM Distance: >3 FB Neck ROM: Full    Dental no notable dental hx.    Pulmonary neg pulmonary ROS,    Pulmonary exam normal breath sounds clear to auscultation       Cardiovascular hypertension, Pt. on medications and Pt. on home beta blockers + CAD and + Past MI  Normal cardiovascular exam Rhythm:Regular Rate:Normal     Neuro/Psych negative neurological ROS  negative psych ROS   GI/Hepatic negative GI ROS, Neg liver ROS,   Endo/Other  diabetes  Renal/GU negative Renal ROS  negative genitourinary   Musculoskeletal negative musculoskeletal ROS (+)   Abdominal   Peds negative pediatric ROS (+)  Hematology negative hematology ROS (+)   Anesthesia Other Findings   Reproductive/Obstetrics negative OB ROS                            Anesthesia Physical Anesthesia Plan  ASA: III  Anesthesia Plan: General   Post-op Pain Management:    Induction: Intravenous  PONV Risk Score and Plan: 3 and Ondansetron, Dexamethasone and Treatment may vary due to age or medical condition  Airway Management Planned: Oral ETT  Additional Equipment:   Intra-op Plan:   Post-operative Plan: Extubation in OR  Informed Consent: I have reviewed the patients History and Physical, chart, labs and discussed the procedure including the risks, benefits and alternatives for the proposed anesthesia with the patient or authorized representative who has indicated his/her understanding and acceptance.     Dental advisory given  Plan Discussed with: CRNA and Surgeon  Anesthesia Plan Comments: (See PAT note 06/04/2020, Jodell Cipro, PA-C)       Anesthesia Quick Evaluation

## 2020-06-07 NOTE — Progress Notes (Signed)
Anesthesia Chart Review   Case: [id number] Date/Time: 06/13/20 0815   Procedures:      NEPHROLITHOTOMY PERCUTANEOUS (Left ) - 3 HRS     URETEROSCOPY (Left )     HOLMIUM LASER APPLICATION (Left )   Anesthesia type: General   Pre-op diagnosis: LEFT RENAL AND URETERAL STONES   Location: [facility] ROOM 05 / [facility]   Surgeons: Sebastian Ache, MD      DISCUSSION:74 y.o. never smoker with h/o HLD, HTN, CAD (stent 12/08/19), DM II, left renal and ureteral stone scheduled for above procedure 06/13/2020 with Dr. Sebastian Ache.   Pt last seen by cardiology 05/01/2020.  Per OV note, "Preoperative evaluation: She has procedure scheduled for July 28 and her urologist wants her to stop her aspirin and Plavix.  I will check with interventional cardiologist who did her procedure.  I wonder whether it would be okay to stop the Plavix and keep her on aspirin.  I am not sure about stopping or interrupting both antiplatelet agents but I will most certainly await input from Dr. Eldridge Dace whom I have messaged.  We will communicate this with the patient and her urologist."  Per Dr. Eldridge Dace 06/06/2020, "It has been 6 months since PCI.  I would prefer that she stay on aspirin alone during surgery. If this is not possible due to bleeding risk during surgery, then she can stop both, aspirin and plavix, but would restart antiplatelet therapy ASAP post op, when safe from a bleeding standpoint."  Anticipate pt can proceed with planned procedure barring acute status change.   VS: BP 127/66 (BP Location: Left Arm)   Pulse (!) 55   Temp 36.7 C (Oral)   Resp 16   Ht 5\' 3"  (1.6 m)   Wt 62.8 kg   SpO2 100%   BMI 24.53 kg/m   PROVIDERS: , MD is PCP   Buckner Malta, MD is Cardiologist  LABS: Labs reviewed: Acceptable for surgery. (all labs ordered are listed, but only abnormal results are displayed)  Labs Reviewed  GLUCOSE, CAPILLARY - Abnormal; Notable for the following components:      Result Value    Glucose-Capillary 105 (*)    All other components within normal limits  BASIC METABOLIC PANEL - Abnormal; Notable for the following components:   Glucose, Bld 116 (*)    All other components within normal limits  CBC - Abnormal; Notable for the following components:   Hemoglobin 11.1 (*)    HCT 35.2 (*)    All other components within normal limits  HEMOGLOBIN A1C - Abnormal; Notable for the following components:   Hgb A1c MFr Bld 6.4 (*)    All other components within normal limits     IMAGES:   EKG: 12/02/2019 Rate 64 bpm  Normal sinus rhythm Abnormal QRS-T angle, consider primary T wave abnormality Abnormal ECG motion artifact No significant change since last tracing  CV: Cardiac Cath 12/02/2019  Mid LAD-2 lesion is 80% stenosed.  A drug-eluting stent was successfully placed using a SYNERGY XD 2.50X24.  Post intervention, there is a 0% residual stenosis.  Mid LAD-1 lesion is 70% stenosed.  A drug-eluting stent was successfully placed using a SYNERGY XD 2.75X8.  Post intervention, there is a 0% residual stenosis.  2nd Diag lesion is 75% stenosed.  Balloon angioplasty was performed using a BALLOON SAPPHIRE 2.25X15.  Post intervention, there is a 25% residual stenosis.  The left ventricular systolic function is normal.  LV end diastolic pressure is normal.  The left ventricular ejection fraction is 55-65% by visual estimate.  There is no aortic valve stenosis.   Complex, mid LAD bifurcation lesion.  Size differential from proximal to mid LAD due to large diagonals originating from LAD.  Difficult to lay out mid LAD.  IVUS was helpful to show actual vessel size and guide postdilatation.   Echo 11/13/2019 Conclusions 1. There is normal global left ventricular contractility 2. Overall left ventricular systolic function is normal with, an EF between 55-60% 3. Apical lateral LV wall motion is hypokinetic Past Medical History:  Diagnosis Date  . Coronary artery  disease   . Diabetes mellitus type 2, controlled (HCC) 2016  . Essential hypertension   . History of kidney stones   . Hyperlipidemia   . Myocardial infarction (HCC) 11/2019  . Nephrolithiasis     Past Surgical History:  Procedure Laterality Date  . CARDIAC CATHETERIZATION    . CORONARY BALLOON ANGIOPLASTY N/A 12/02/2019   Procedure: CORONARY BALLOON ANGIOPLASTY;  Surgeon: Corky Crafts, MD;  Location: [facility] INVASIVE CV LAB;  Service: Cardiovascular;  Laterality: N/A;  . CORONARY STENT INTERVENTION N/A 12/02/2019   Procedure: CORONARY STENT INTERVENTION;  Surgeon: Corky Crafts, MD;  Location: [facility] INVASIVE CV LAB;  Service: Cardiovascular;  Laterality: N/A;  . INTRAVASCULAR ULTRASOUND/IVUS N/A 12/02/2019   Procedure: Intravascular Ultrasound/IVUS;  Surgeon: Corky Crafts, MD;  Location: [facility] INVASIVE CV LAB;  Service: Cardiovascular;  Laterality: N/A;  . IR NEPHROSTOMY EXCHANGE LEFT  12/28/2019  . IR NEPHROSTOMY EXCHANGE LEFT  02/09/2020  . IR NEPHROSTOMY EXCHANGE LEFT  03/22/2020  . IR NEPHROSTOMY EXCHANGE LEFT  05/03/2020  . IR NEPHROSTOMY PLACEMENT LEFT  11/16/2019  . LEFT HEART CATH AND CORONARY ANGIOGRAPHY N/A 12/02/2019   Procedure: LEFT HEART CATH AND CORONARY ANGIOGRAPHY;  Surgeon: Corky Crafts, MD;  Location: [facility] INVASIVE CV LAB;  Service: Cardiovascular;  Laterality: N/A;  . LITHOTRIPSY    . TUBAL LIGATION  1984    MEDICATIONS: . acetaminophen (TYLENOL) 325 MG tablet  . alendronate (FOSAMAX) 70 MG tablet  . aspirin EC 81 MG EC tablet  . atorvastatin (LIPITOR) 40 MG tablet  . Cholecalciferol (VITAMIN D-3) 125 MCG (5000 UT) TABS  . clopidogrel (PLAVIX) 75 MG tablet  . enalapril (VASOTEC) 20 MG tablet  . ferrous sulfate 325 (65 FE) MG tablet  . fluticasone (FLONASE) 50 MCG/ACT nasal spray  . metFORMIN (GLUCOPHAGE) 1000 MG tablet  . metoprolol tartrate (LOPRESSOR) 25 MG tablet  . nitroGLYCERIN (NITROSTAT) 0.4 MG SL tablet  . polyethylene glycol (MIRALAX /  GLYCOLAX) 17 g packet  . sertraline (ZOLOFT) 50 MG tablet  . sitaGLIPtin (JANUVIA) 25 MG tablet  . vitamin B-12 (CYANOCOBALAMIN) 1000 MCG tablet   No current facility-administered medications for this encounter.   Jodell Cipro, PA-C [facility] [phone]

## 2020-06-07 NOTE — Telephone Encounter (Signed)
   Primary Cardiologist: Garwin Brothers, MD  Chart reviewed and patient contacted by phone today as part of pre-operative protocol coverage. Given past medical history and time since last visit, based on ACC/AHA guidelines, Claudia Christian would be at acceptable risk for the planned procedure without further cardiovascular testing.   OK to hold Plavix 5-7 days pre op.  We would prefer the patient stay on aspirin 81 mg but this can be held if its felt the bleeding risk is prohibitive.  Resume both as soon as possible post op.   I will route this recommendation to the requesting party via Epic fax function and remove from pre-op pool.  Please call with questions.  Corine Shelter, PA-C 06/07/2020, 8:35 AM

## 2020-06-09 ENCOUNTER — Other Ambulatory Visit (HOSPITAL_COMMUNITY)
Admission: RE | Admit: 2020-06-09 | Discharge: 2020-06-09 | Disposition: A | Discharge status: Home / Self Care | Payer: Medicare PPO | Source: Ambulatory Visit | Attending: Urology | Admitting: Urology

## 2020-06-09 DIAGNOSIS — Z20822 Contact with and (suspected) exposure to covid-19: Secondary | ICD-10-CM | POA: Insufficient documentation

## 2020-06-09 DIAGNOSIS — Z01812 Encounter for preprocedural laboratory examination: Secondary | ICD-10-CM | POA: Insufficient documentation

## 2020-06-09 LAB — SARS CORONAVIRUS 2 (TAT 6-24 HRS): SARS Coronavirus 2: NEGATIVE

## 2020-06-12 MED ORDER — GENTAMICIN SULFATE 40 MG/ML IJ SOLN
5.0000 mg/kg | INTRAVENOUS | Status: AC
  Administered 2020-06-13: 320 mg via INTRAVENOUS
  Filled 2020-06-12: qty 8

## 2020-06-13 ENCOUNTER — Ambulatory Visit (HOSPITAL_COMMUNITY): Payer: Medicare PPO | Admitting: Physician Assistant

## 2020-06-13 ENCOUNTER — Encounter (HOSPITAL_COMMUNITY): Payer: Self-pay | Admitting: Urology

## 2020-06-13 ENCOUNTER — Ambulatory Visit (HOSPITAL_COMMUNITY): Payer: Medicare PPO | Admitting: Anesthesiology

## 2020-06-13 ENCOUNTER — Ambulatory Visit (HOSPITAL_COMMUNITY): Payer: Medicare PPO

## 2020-06-13 ENCOUNTER — Observation Stay (HOSPITAL_COMMUNITY)
Admission: RE | Admit: 2020-06-13 | Discharge: 2020-06-14 | Disposition: A | Discharge status: Home / Self Care | Payer: Medicare PPO | Attending: Urology | Admitting: Urology

## 2020-06-13 ENCOUNTER — Other Ambulatory Visit: Payer: Self-pay

## 2020-06-13 ENCOUNTER — Encounter (HOSPITAL_COMMUNITY)
Admission: RE | Disposition: A | Discharge status: Home / Self Care | Payer: Self-pay | Source: Home / Self Care | Attending: Urology

## 2020-06-13 DIAGNOSIS — I119 Hypertensive heart disease without heart failure: Secondary | ICD-10-CM | POA: Diagnosis not present

## 2020-06-13 DIAGNOSIS — A419 Sepsis, unspecified organism: Secondary | ICD-10-CM | POA: Diagnosis not present

## 2020-06-13 DIAGNOSIS — N132 Hydronephrosis with renal and ureteral calculous obstruction: Secondary | ICD-10-CM | POA: Diagnosis not present

## 2020-06-13 DIAGNOSIS — N200 Calculus of kidney: Secondary | ICD-10-CM | POA: Diagnosis not present

## 2020-06-13 DIAGNOSIS — E119 Type 2 diabetes mellitus without complications: Secondary | ICD-10-CM | POA: Diagnosis not present

## 2020-06-13 DIAGNOSIS — N202 Calculus of kidney with calculus of ureter: Principal | ICD-10-CM | POA: Insufficient documentation

## 2020-06-13 DIAGNOSIS — Z79899 Other long term (current) drug therapy: Secondary | ICD-10-CM | POA: Diagnosis not present

## 2020-06-13 DIAGNOSIS — I10 Essential (primary) hypertension: Secondary | ICD-10-CM | POA: Diagnosis not present

## 2020-06-13 DIAGNOSIS — I2510 Atherosclerotic heart disease of native coronary artery without angina pectoris: Secondary | ICD-10-CM | POA: Diagnosis not present

## 2020-06-13 DIAGNOSIS — Z7984 Long term (current) use of oral hypoglycemic drugs: Secondary | ICD-10-CM | POA: Insufficient documentation

## 2020-06-13 DIAGNOSIS — N201 Calculus of ureter: Secondary | ICD-10-CM | POA: Diagnosis not present

## 2020-06-13 DIAGNOSIS — Z7982 Long term (current) use of aspirin: Secondary | ICD-10-CM | POA: Insufficient documentation

## 2020-06-13 HISTORY — PX: URETEROSCOPY: SHX842

## 2020-06-13 HISTORY — PX: NEPHROLITHOTOMY: SHX5134

## 2020-06-13 HISTORY — DX: Calculus of kidney: N20.0

## 2020-06-13 LAB — GLUCOSE, CAPILLARY
Glucose-Capillary: 116 mg/dL — ABNORMAL HIGH (ref 70–99)
Glucose-Capillary: 121 mg/dL — ABNORMAL HIGH (ref 70–99)
Glucose-Capillary: 149 mg/dL — ABNORMAL HIGH (ref 70–99)
Glucose-Capillary: 148 mg/dL — ABNORMAL HIGH (ref 70–99)

## 2020-06-13 LAB — HEMOGLOBIN AND HEMATOCRIT, BLOOD
HCT: 30.7 % — ABNORMAL LOW (ref 36.0–46.0)
Hemoglobin: 9.9 g/dL — ABNORMAL LOW (ref 12.0–15.0)

## 2020-06-13 SURGERY — NEPHROLITHOTOMY PERCUTANEOUS
Anesthesia: General | Laterality: Left

## 2020-06-13 MED ORDER — PHENYLEPHRINE 40 MCG/ML (10ML) SYRINGE FOR IV PUSH (FOR BLOOD PRESSURE SUPPORT)
PREFILLED_SYRINGE | INTRAVENOUS | Status: DC | PRN
  Administered 2020-06-13 (×4): 80 ug via INTRAVENOUS

## 2020-06-13 MED ORDER — LACTATED RINGERS IV SOLN: INTRAVENOUS | Status: DC

## 2020-06-13 MED ORDER — HYDROMORPHONE HCL 1 MG/ML IJ SOLN: 0.5000 mg | INTRAMUSCULAR | Status: DC | PRN

## 2020-06-13 MED ORDER — SODIUM CHLORIDE 0.9 % IV SOLN: INTRAVENOUS | Status: DC

## 2020-06-13 MED ORDER — ROCURONIUM BROMIDE 10 MG/ML (PF) SYRINGE
PREFILLED_SYRINGE | INTRAVENOUS | Status: AC
  Filled 2020-06-13: qty 10

## 2020-06-13 MED ORDER — PHENYLEPHRINE 40 MCG/ML (10ML) SYRINGE FOR IV PUSH (FOR BLOOD PRESSURE SUPPORT)
PREFILLED_SYRINGE | INTRAVENOUS | Status: AC
  Filled 2020-06-13: qty 10

## 2020-06-13 MED ORDER — ASPIRIN EC 81 MG PO TBEC
81.0000 mg | DELAYED_RELEASE_TABLET | Freq: Every day | ORAL | Status: DC
  Administered 2020-06-14: 81 mg via ORAL
  Filled 2020-06-13: qty 1

## 2020-06-13 MED ORDER — LIDOCAINE 2% (20 MG/ML) 5 ML SYRINGE
INTRAMUSCULAR | Status: DC | PRN
  Administered 2020-06-13: 60 mg via INTRAVENOUS

## 2020-06-13 MED ORDER — DEXAMETHASONE SODIUM PHOSPHATE 4 MG/ML IJ SOLN
INTRAMUSCULAR | Status: DC | PRN
  Administered 2020-06-13: 5 mg via INTRAVENOUS

## 2020-06-13 MED ORDER — FENTANYL CITRATE (PF) 250 MCG/5ML IJ SOLN
INTRAMUSCULAR | Status: AC
  Filled 2020-06-13: qty 5

## 2020-06-13 MED ORDER — PHENYLEPHRINE HCL-NACL 10-0.9 MG/250ML-% IV SOLN
INTRAVENOUS | Status: DC | PRN
  Administered 2020-06-13: 50 ug/min via INTRAVENOUS

## 2020-06-13 MED ORDER — ATORVASTATIN CALCIUM 40 MG PO TABS
40.0000 mg | ORAL_TABLET | Freq: Every evening | ORAL | Status: DC
  Administered 2020-06-13: 40 mg via ORAL
  Filled 2020-06-13: qty 1

## 2020-06-13 MED ORDER — INSULIN ASPART 100 UNIT/ML ~~LOC~~ SOLN: 0.0000 [IU] | Freq: Three times a day (TID) | SUBCUTANEOUS | Status: DC

## 2020-06-13 MED ORDER — CHLORHEXIDINE GLUCONATE CLOTH 2 % EX PADS: 6.0000 | MEDICATED_PAD | Freq: Every day | CUTANEOUS | Status: DC

## 2020-06-13 MED ORDER — SODIUM CHLORIDE 0.9 % IV SOLN
INTRAVENOUS | Status: DC | PRN
  Administered 2020-06-13: 15 mL

## 2020-06-13 MED ORDER — FLUCONAZOLE 100MG IVPB
100.0000 mg | Freq: Once | INTRAVENOUS | Status: AC
  Administered 2020-06-13: 100 mg via INTRAVENOUS
  Filled 2020-06-13: qty 50

## 2020-06-13 MED ORDER — MIDAZOLAM HCL 2 MG/2ML IJ SOLN
INTRAMUSCULAR | Status: AC
  Filled 2020-06-13: qty 2

## 2020-06-13 MED ORDER — METFORMIN HCL 500 MG PO TABS
1000.0000 mg | ORAL_TABLET | Freq: Two times a day (BID) | ORAL | Status: DC
  Administered 2020-06-13 – 2020-06-14 (×2): 1000 mg via ORAL
  Filled 2020-06-13 (×3): qty 2

## 2020-06-13 MED ORDER — ASPIRIN 81 MG PO TBEC: 81.0000 mg | DELAYED_RELEASE_TABLET | Freq: Every day | ORAL | Status: DC

## 2020-06-13 MED ORDER — SODIUM CHLORIDE 0.9 % IR SOLN
Status: DC | PRN
  Administered 2020-06-13: 9000 mL

## 2020-06-13 MED ORDER — METFORMIN HCL 500 MG PO TABS: 1000.0000 mg | ORAL_TABLET | Freq: Two times a day (BID) | ORAL | Status: DC

## 2020-06-13 MED ORDER — SUGAMMADEX SODIUM 200 MG/2ML IV SOLN
INTRAVENOUS | Status: DC | PRN
  Administered 2020-06-13: 200 mg via INTRAVENOUS

## 2020-06-13 MED ORDER — PROPOFOL 10 MG/ML IV BOLUS
INTRAVENOUS | Status: AC
  Filled 2020-06-13: qty 20

## 2020-06-13 MED ORDER — ONDANSETRON HCL 4 MG/2ML IJ SOLN
INTRAMUSCULAR | Status: AC
  Filled 2020-06-13: qty 2

## 2020-06-13 MED ORDER — CHLORHEXIDINE GLUCONATE 0.12 % MT SOLN
15.0000 mL | Freq: Once | OROMUCOSAL | Status: AC
  Administered 2020-06-13: 15 mL via OROMUCOSAL

## 2020-06-13 MED ORDER — ACETAMINOPHEN 500 MG PO TABS
1000.0000 mg | ORAL_TABLET | Freq: Three times a day (TID) | ORAL | Status: AC
  Administered 2020-06-13: 1000 mg via ORAL
  Filled 2020-06-13 (×2): qty 2

## 2020-06-13 MED ORDER — PHENYLEPHRINE HCL (PRESSORS) 10 MG/ML IV SOLN
INTRAVENOUS | Status: AC
  Filled 2020-06-13: qty 1

## 2020-06-13 MED ORDER — ROCURONIUM BROMIDE 10 MG/ML (PF) SYRINGE
PREFILLED_SYRINGE | INTRAVENOUS | Status: DC | PRN
  Administered 2020-06-13: 70 mg via INTRAVENOUS

## 2020-06-13 MED ORDER — ONDANSETRON HCL 4 MG/2ML IJ SOLN
INTRAMUSCULAR | Status: DC | PRN
  Administered 2020-06-13: 4 mg via INTRAVENOUS

## 2020-06-13 MED ORDER — ORAL CARE MOUTH RINSE: 15.0000 mL | Freq: Once | OROMUCOSAL | Status: AC

## 2020-06-13 MED ORDER — SENNOSIDES-DOCUSATE SODIUM 8.6-50 MG PO TABS
1.0000 | ORAL_TABLET | Freq: Two times a day (BID) | ORAL | Status: DC
  Administered 2020-06-13 – 2020-06-14 (×2): 1 via ORAL
  Filled 2020-06-13 (×2): qty 1

## 2020-06-13 MED ORDER — OXYCODONE HCL 5 MG PO TABS: 5.0000 mg | ORAL_TABLET | ORAL | Status: DC | PRN

## 2020-06-13 MED ORDER — SERTRALINE HCL 50 MG PO TABS: 50.0000 mg | ORAL_TABLET | Freq: Every day | ORAL | Status: DC

## 2020-06-13 MED ORDER — PROPOFOL 10 MG/ML IV BOLUS
INTRAVENOUS | Status: DC | PRN
  Administered 2020-06-13: 80 mg via INTRAVENOUS

## 2020-06-13 MED ORDER — FENTANYL CITRATE (PF) 100 MCG/2ML IJ SOLN
INTRAMUSCULAR | Status: DC | PRN
  Administered 2020-06-13 (×3): 50 ug via INTRAVENOUS

## 2020-06-13 MED ORDER — HYDROMORPHONE HCL 1 MG/ML IJ SOLN: 0.2500 mg | INTRAMUSCULAR | Status: DC | PRN

## 2020-06-13 MED ORDER — DEXAMETHASONE SODIUM PHOSPHATE 10 MG/ML IJ SOLN
INTRAMUSCULAR | Status: AC
  Filled 2020-06-13: qty 1

## 2020-06-13 MED ORDER — MIDAZOLAM HCL 5 MG/5ML IJ SOLN
INTRAMUSCULAR | Status: DC | PRN
  Administered 2020-06-13: 2 mg via INTRAVENOUS

## 2020-06-13 MED ORDER — METOPROLOL TARTRATE 25 MG PO TABS
12.5000 mg | ORAL_TABLET | Freq: Two times a day (BID) | ORAL | Status: DC
  Administered 2020-06-14: 12.5 mg via ORAL
  Filled 2020-06-13 (×2): qty 1

## 2020-06-13 MED ORDER — SERTRALINE HCL 50 MG PO TABS
50.0000 mg | ORAL_TABLET | Freq: Every day | ORAL | Status: DC
  Administered 2020-06-14: 50 mg via ORAL
  Filled 2020-06-13: qty 1

## 2020-06-13 MED ORDER — LIDOCAINE 2% (20 MG/ML) 5 ML SYRINGE
INTRAMUSCULAR | Status: AC
  Filled 2020-06-13: qty 5

## 2020-06-13 SURGICAL SUPPLY — 69 items
BAG URINE DRAIN 2000ML AR STRL (UROLOGICAL SUPPLIES) ×6 IMPLANT
BAG URO CATCHER STRL LF (MISCELLANEOUS) ×3 IMPLANT
BASKET LASER NITINOL 1.9FR (BASKET) ×3 IMPLANT
BASKET ZERO TIP NITINOL 2.4FR (BASKET) IMPLANT
BENZOIN TINCTURE PRP APPL 2/3 (GAUZE/BANDAGES/DRESSINGS) ×3 IMPLANT
BLADE SURG 15 STRL LF DISP TIS (BLADE) ×1 IMPLANT
BLADE SURG 15 STRL SS (BLADE) ×2
CATH FOLEY 2W COUNCIL 20FR 5CC (CATHETERS) IMPLANT
CATH FOLEY 2WAY SLVR  5CC 16FR (CATHETERS) ×2
CATH FOLEY 2WAY SLVR 5CC 16FR (CATHETERS) ×1 IMPLANT
CATH IMAGER II 65CM (CATHETERS) ×3 IMPLANT
CATH INTERMIT  6FR 70CM (CATHETERS) ×3 IMPLANT
CATH MULTI PURPOSE 16FR DRAIN (CATHETERS) IMPLANT
CATH ROBINSON RED A/P 20FR (CATHETERS) IMPLANT
CATH ULTRATHANE 14FR (CATHETERS) IMPLANT
CATH X-FORCE N30 NEPHROSTOMY (TUBING) ×3 IMPLANT
CHLORAPREP W/TINT 26 (MISCELLANEOUS) ×6 IMPLANT
COVER WAND RF STERILE (DRAPES) IMPLANT
DRAPE C-ARM 42X120 X-RAY (DRAPES) ×3 IMPLANT
DRAPE LINGEMAN PERC (DRAPES) ×3 IMPLANT
DRAPE SHEET LG 3/4 BI-LAMINATE (DRAPES) ×3 IMPLANT
DRAPE SURG IRRIG POUCH 19X23 (DRAPES) ×3 IMPLANT
DRSG PAD ABDOMINAL 8X10 ST (GAUZE/BANDAGES/DRESSINGS) IMPLANT
DRSG TEGADERM 4X4.75 (GAUZE/BANDAGES/DRESSINGS) ×3 IMPLANT
DRSG TEGADERM 8X12 (GAUZE/BANDAGES/DRESSINGS) ×6 IMPLANT
FIBER LASER FLEXIVA 1000 (UROLOGICAL SUPPLIES) IMPLANT
FIBER LASER FLEXIVA 365 (UROLOGICAL SUPPLIES) IMPLANT
FIBER LASER FLEXIVA 550 (UROLOGICAL SUPPLIES) IMPLANT
GAUZE SPONGE 2X2 8PLY STRL LF (GAUZE/BANDAGES/DRESSINGS) ×1 IMPLANT
GAUZE SPONGE 4X4 12PLY STRL (GAUZE/BANDAGES/DRESSINGS) IMPLANT
GLOVE BIOGEL M STRL SZ7.5 (GLOVE) ×9 IMPLANT
GOWN STRL REUS W/TWL LRG LVL3 (GOWN DISPOSABLE) ×6 IMPLANT
GUIDEWIRE AMPLAZ .035X145 (WIRE) ×3 IMPLANT
GUIDEWIRE ANG ZIPWIRE 038X150 (WIRE) ×3 IMPLANT
GUIDEWIRE STR DUAL SENSOR (WIRE) IMPLANT
IV SET EXTENSION CATH 6 NF (IV SETS) ×3 IMPLANT
KIT BASIN OR (CUSTOM PROCEDURE TRAY) ×3 IMPLANT
KIT PROBE 340X3.4XDISP GRN (MISCELLANEOUS) IMPLANT
KIT PROBE TRILOGY 3.4X340 (MISCELLANEOUS)
KIT PROBE TRILOGY 3.9X350 (MISCELLANEOUS) ×3 IMPLANT
KIT TURNOVER KIT A (KITS) IMPLANT
LASER FIBER FLEXIV TRACTIP 200 (Laser) IMPLANT
MANIFOLD NEPTUNE II (INSTRUMENTS) ×3 IMPLANT
NEEDLE TROCAR 18X15 ECHO (NEEDLE) IMPLANT
NEEDLE TROCAR 18X20 (NEEDLE) IMPLANT
NS IRRIG 1000ML POUR BTL (IV SOLUTION) ×3 IMPLANT
PACK CYSTO (CUSTOM PROCEDURE TRAY) ×3 IMPLANT
SHEATH PEELAWAY SET 9 (SHEATH) IMPLANT
SPONGE GAUZE 2X2 STER 10/PKG (GAUZE/BANDAGES/DRESSINGS) ×2
SPONGE LAP 4X18 RFD (DISPOSABLE) ×3 IMPLANT
STENT URET 6FRX24 CONTOUR (STENTS) ×3 IMPLANT
SURGIFLO W/THROMBIN 8M KIT (HEMOSTASIS) ×3 IMPLANT
SUT SILK 2 0 30  PSL (SUTURE) ×2
SUT SILK 2 0 30 PSL (SUTURE) ×1 IMPLANT
SUT VIC AB 2-0 CT1 27 (SUTURE) ×2
SUT VIC AB 2-0 CT1 TAPERPNT 27 (SUTURE) ×1 IMPLANT
SYR 10ML LL (SYRINGE) ×3 IMPLANT
SYR 20ML LL LF (SYRINGE) ×6 IMPLANT
SYR 50ML LL SCALE MARK (SYRINGE) ×3 IMPLANT
TOWEL OR 17X26 10 PK STRL BLUE (TOWEL DISPOSABLE) ×3 IMPLANT
TRAY FOLEY MTR SLVR 16FR STAT (SET/KITS/TRAYS/PACK) ×3 IMPLANT
TUBE CONNECTING VINYL 14FR 30C (TUBING) ×3 IMPLANT
TUBE FEEDING 8FR 16IN STR KANG (MISCELLANEOUS) ×3 IMPLANT
TUBING CONNECTING 10 (TUBING) ×6 IMPLANT
TUBING CONNECTING 10' (TUBING) ×3
TUBING STONE CATCHER TRILOGY (MISCELLANEOUS) IMPLANT
TUBING UROLOGY SET (TUBING) ×3 IMPLANT
WATER STERILE IRR 1000ML POUR (IV SOLUTION) ×3 IMPLANT
WATER STERILE IRR 3000ML UROMA (IV SOLUTION) ×3 IMPLANT

## 2020-06-13 NOTE — Anesthesia Procedure Notes (Signed)
Procedure Name: Intubation Date/Time: 06/13/2020 8:34 AM Performed by: Vanessa St. Hilaire, CRNA Pre-anesthesia Checklist: Patient identified, Emergency Drugs available, Suction available and Patient being monitored Patient Re-evaluated:Patient Re-evaluated prior to induction Oxygen Delivery Method: Circle system utilized Preoxygenation: Pre-oxygenation with 100% oxygen Induction Type: IV induction Ventilation: Mask ventilation without difficulty Laryngoscope Size: 2 and Miller Grade View: Grade I Tube type: Oral Tube size: 7.0 mm Number of attempts: 1 Airway Equipment and Method: Stylet Placement Confirmation: ETT inserted through vocal cords under direct vision,  positive ETCO2 and breath sounds checked- equal and bilateral Secured at: 21 cm Tube secured with: Tape Dental Injury: Teeth and Oropharynx as per pre-operative assessment

## 2020-06-13 NOTE — Transfer of Care (Signed)
Immediate Anesthesia Transfer of Care Note  Patient: Claudia Christian  Procedure(s) Performed: NEPHROLITHOTOMY PERCUTANEOUS (Left ) URETEROSCOPY WITH BASKETING AND STENT PLACEMENT (Left )  Patient Location: PACU  Anesthesia Type:General  Level of Consciousness: awake and patient cooperative  Airway & Oxygen Therapy: Patient Spontanous Breathing and Patient connected to face mask  Post-op Assessment: Report given to RN and Post -op Vital signs reviewed and stable  Post vital signs: Reviewed and stable  Last Vitals:  Vitals Value Taken Time  BP 145/72 06/13/20 1019  Temp    Pulse 71 06/13/20 1021  Resp 14 06/13/20 1021  SpO2 100 % 06/13/20 1021  Vitals shown include unvalidated device data.  Last Pain:  Vitals:   06/13/20 0648  TempSrc: Oral  PainSc: 0-No pain         Complications: No complications documented.

## 2020-06-13 NOTE — Anesthesia Postprocedure Evaluation (Signed)
Anesthesia Post Note  Patient: Claudia Christian  Procedure(s) Performed: NEPHROLITHOTOMY PERCUTANEOUS (Left ) URETEROSCOPY WITH BASKETING AND STENT PLACEMENT (Left )     Patient location during evaluation: PACU Anesthesia Type: General Level of consciousness: awake and alert Pain management: pain level controlled Vital Signs Assessment: post-procedure vital signs reviewed and stable Respiratory status: spontaneous breathing, nonlabored ventilation, respiratory function stable and patient connected to nasal cannula oxygen Cardiovascular status: blood pressure returned to baseline and stable Postop Assessment: no apparent nausea or vomiting Anesthetic complications: no   No complications documented.  Last Vitals:  Vitals:   06/13/20 1200 06/13/20 1300  BP: (!) 105/62 (!) 100/60  Pulse: 73 70  Resp: 15 20  Temp: 36.6 C 36.6 C  SpO2: 99% 100%    Last Pain:  Vitals:   06/13/20 1300  TempSrc:   PainSc: 0-No pain                 [name]

## 2020-06-13 NOTE — H&P (Signed)
Claudia Christian is an 74 y.o. female.    Chief Complaint: Pre-OP LEFT Percutaneous Nephrostolithotomy  HPI:   1 - 1 - Recurrent Urolithiasis -  Pre 2020 - URS x 1, SWL x 1 in [place]     10/2019 Large Left Ureteral + Partial Staghorn Stone - 1cm left UPJ + 3cm lower pole stones (1100HU) by CT at [facility] 11/14/19 ==> Neph tube placed. Then had DES placed 11/2019.     2 - Bacteriuria, Funguria - bacteruria with some concern of mild pyelo per report from referring hospital. Placed in empiric rocephin. UCX 10/2019 yeast only. UCX 7/202 non-clonal.     PMH sig for BTL, DM2, CAD/Stent (follows Kathrynn Running MD [facility]). NO baseline functional limitations from health, independent in all ADL's lives with husband. PCP is Buckner Malta MD with [facility] in [place]     Today " Claudia Christian " is seen to proceed with LEFT PCNL. She is on bactrim and diflucan pre-op. NO interval fevers.    Past Medical History:  Diagnosis Date  . Coronary artery disease   . Diabetes mellitus type 2, controlled (HCC) 2016  . Essential hypertension   . History of kidney stones   . Hyperlipidemia   . Myocardial infarction (HCC) 11/2019  . Nephrolithiasis     Past Surgical History:  Procedure Laterality Date  . CARDIAC CATHETERIZATION    . CORONARY BALLOON ANGIOPLASTY N/A 12/02/2019   Procedure: CORONARY BALLOON ANGIOPLASTY;  Surgeon: Corky Crafts, MD;  Location: [facility] INVASIVE CV LAB;  Service: Cardiovascular;  Laterality: N/A;  . CORONARY STENT INTERVENTION N/A 12/02/2019   Procedure: CORONARY STENT INTERVENTION;  Surgeon: Corky Crafts, MD;  Location: [facility] INVASIVE CV LAB;  Service: Cardiovascular;  Laterality: N/A;  . INTRAVASCULAR ULTRASOUND/IVUS N/A 12/02/2019   Procedure: Intravascular Ultrasound/IVUS;  Surgeon: Corky Crafts, MD;  Location: [facility] INVASIVE CV LAB;  Service: Cardiovascular;  Laterality: N/A;  . IR NEPHROSTOMY EXCHANGE LEFT  12/28/2019  . IR NEPHROSTOMY EXCHANGE  LEFT  02/09/2020  . IR NEPHROSTOMY EXCHANGE LEFT  03/22/2020  . IR NEPHROSTOMY EXCHANGE LEFT  05/03/2020  . IR NEPHROSTOMY PLACEMENT LEFT  11/16/2019  . LEFT HEART CATH AND CORONARY ANGIOGRAPHY N/A 12/02/2019   Procedure: LEFT HEART CATH AND CORONARY ANGIOGRAPHY;  Surgeon: Corky Crafts, MD;  Location: [facility] INVASIVE CV LAB;  Service: Cardiovascular;  Laterality: N/A;  . LITHOTRIPSY    . TUBAL LIGATION  1984    Family History  Problem Relation Age of Onset  . Heart attack Mother   . Heart attack Father   . Heart attack Brother    Social History:  reports that she has never smoked. She has never used smokeless tobacco. She reports that she does not drink alcohol and does not use drugs.  Allergies: No Known Allergies  Medications Prior to Admission  Medication Sig Dispense Refill  . acetaminophen (TYLENOL) 325 MG tablet Take 2 tablets (650 mg total) by mouth every 6 (six) hours as needed for mild pain (or Fever >/= 101). 30 tablet 0  . alendronate (FOSAMAX) 70 MG tablet Take 70 mg by mouth once a week. Take with a full glass of water on an empty stomach.     Marland Kitchen aspirin EC 81 MG EC tablet Take 1 tablet (81 mg total) by mouth daily. 30 tablet 0  . atorvastatin (LIPITOR) 40 MG tablet Take 1 tablet (40 mg total) by mouth daily. (Patient taking differently: Take 40 mg by mouth every evening. )  30 tablet 0  . Cholecalciferol (VITAMIN D-3) 125 MCG (5000 UT) TABS Take 5,000 Units by mouth daily.    . clopidogrel (PLAVIX) 75 MG tablet Take 1 tablet (75 mg total) by mouth daily. Start taking 24 hours after 300 mg Plavix. (Patient taking differently: Take 75 mg by mouth daily. ) 90 tablet 3  . enalapril (VASOTEC) 20 MG tablet Take 20 mg by mouth daily.    . ferrous sulfate 325 (65 FE) MG tablet Take 325 mg by mouth daily.    . fluticasone (FLONASE) 50 MCG/ACT nasal spray Place 1-2 sprays into both nostrils daily as needed for allergies or rhinitis.    . metFORMIN (GLUCOPHAGE) 1000 MG tablet Take  1,000 mg by mouth 2 (two) times daily with a meal.    . metoprolol tartrate (LOPRESSOR) 25 MG tablet TAKE 1/2 TABLET BY MOUTH TWICE DAILY (Patient taking differently: Take 12.5 mg by mouth 2 (two) times daily. ) 30 tablet 3  . nitroGLYCERIN (NITROSTAT) 0.4 MG SL tablet Place 1 tablet (0.4 mg total) under the tongue every 5 (five) minutes as needed for chest pain (up to 3 doses. If taking 3rd dose call 911). 25 tablet 3  . polyethylene glycol (MIRALAX / GLYCOLAX) 17 g packet Take 17 g by mouth daily as needed for mild constipation. 14 each 0  . sertraline (ZOLOFT) 50 MG tablet Take 50 mg by mouth daily.    . sitaGLIPtin (JANUVIA) 25 MG tablet Take 25 mg by mouth at bedtime.     . vitamin B-12 (CYANOCOBALAMIN) 1000 MCG tablet Take 1,000 mcg by mouth daily.      Results for orders placed or performed during the hospital encounter of 06/13/20 (from the past 48 hour(s))  Glucose, capillary     Status: Abnormal   Collection Time: 06/13/20  7:00 AM  Result Value Ref Range   Glucose-Capillary 116 (H) 70 - 99 mg/dL    Comment: Glucose reference range applies only to samples taken after fasting for at least 8 hours.   No results found.  Review of Systems  Constitutional: Negative for chills and fever.  All other systems reviewed and are negative.   Blood pressure (!) 112/64, pulse 61, temperature 98.9 F (37.2 C), temperature source Oral, resp. rate 18, weight 62.8 kg, SpO2 99 %. Physical Exam Vitals reviewed.  HENT:     Head: Normocephalic.     Nose: Nose normal.     Mouth/Throat:     Mouth: Mucous membranes are moist.  Cardiovascular:     Rate and Rhythm: Normal rate.     Pulses: Normal pulses.  Pulmonary:     Effort: Pulmonary effort is normal.  Abdominal:     General: Abdomen is flat.  Genitourinary:    Comments: Left neph tube in place Musculoskeletal:        General: Normal range of motion.     Cervical back: Normal range of motion.  Skin:    General: Skin is warm.   Neurological:     General: No focal deficit present.     Mental Status: She is alert.      Assessment/Plan  Proceed as planned with LEFT PCNL for large left renal / prox ureteral stone with goal of stone free. Risks, benefits, alternatives, expected peri-op course discussed previously and reiterated today.   Sebastian Ache, MD 06/13/2020, 8:01 AM

## 2020-06-13 NOTE — Brief Op Note (Signed)
06/13/2020  10:06 AM  PATIENT:  Claudia Christian  74 y.o. female  PRE-OPERATIVE DIAGNOSIS:  LEFT RENAL AND URETERAL STONES  POST-OPERATIVE DIAGNOSIS:  LEFT RENAL AND URETERAL STONES  PROCEDURE:  Procedure(s) with comments: NEPHROLITHOTOMY PERCUTANEOUS (Left) - 3 HRS URETEROSCOPY WITH BASKETING AND STENT PLACEMENT (Left)  SURGEON:  Surgeon(s) and Role:    Sebastian Ache, MD - Primary  PHYSICIAN ASSISTANT:   ASSISTANTS: none   ANESTHESIA:   general  EBL:  20 mL   BLOOD ADMINISTERED:none  DRAINS: foley to gravity   LOCAL MEDICATIONS USED:  NONE  SPECIMEN:  Source of Specimen:  left renal / ureteral stone fragments   DISPOSITION OF SPECIMEN:  [facility] for compositional analysis, some given to pt  COUNTS:  YES  TOURNIQUET:  * No tourniquets in log *  DICTATION: .Other Dictation: Dictation Number  [id number]  PLAN OF CARE: Admit for overnight observation  PATIENT DISPOSITION:  PACU - hemodynamically stable.   Delay start of Pharmacological VTE agent (>24hrs) due to surgical blood loss or risk of bleeding: yes

## 2020-06-13 NOTE — Op Note (Signed)
Claudia Christian, Claudia Christian MEDICAL RECORD [id number] ACCOUNT [id number] DATE OF BIRTH:[birth year] FACILITY: [facility] LOCATION: [facility] PHYSICIAN:Humza Tallerico, MD  OPERATIVE REPORT  DATE OF PROCEDURE:  06/13/2020  PREOPERATIVE DIAGNOSIS:  Left partial staghorn and ureteral stones.  PROCEDURE: 1.  Left percutaneous nephrostolithotomy stone greater than 2 cm. 2.  Left ureteroscopy, basketing of stone. 3.  Placement of left ureteral stent 6 x 24 Contour, no tether. 4.  Left antegrade nephrostogram interpretation.  INDICATIONS:  The patient is a pleasant 74 year old lady who was found almost 7 months ago to have a partial staghorn stone and a large proximal left ureteral stone.  At the same time, she also had acute cardiovascular event and underwent cardiac  stenting with a drug-eluting stent via nephrostomy tube was placed for renal decompression.  Given her drug-eluting stent placement and need for dual antiplatelet therapy, we waited 6 months and she now has been cleared to discontinue her stronger  antiplatelet therapy, and felt to be suitable to proceed with left percutaneous nephrostolithotomy.  She has known bacteriuria and funguria that is mild.  She has been on culture specific antibiotics and antifungals preoperatively.  Informed consent was  obtained and placed in medical record.  DESCRIPTION OF PROCEDURE:  Patient identified as patient, procedure being left percutaneous nephrostolithotomy with ureteroscopy was confirmed.  Procedure timeout was performed.  Intravenous access administered.  General endotracheal anesthesia induced.   The patient then placed into a prone position after Foley catheter was placed free to straight drain.  Axillary rolls were used as were chest rolls.  Her knees and ankles were padded.  A prone view was used at the level of her face and sterile field was  created prepping and draping the patient's left flank and in situ nephrostomy tube using chlorhexidine  gluconate and a percutaneous drape was applied over this site.  Initial antegrade nephrostogram revealed good placement of nephrostomy tube in the  lower pole calix.  No evidence of extravasation.  There was a calcification noted within the UPJ area consistent with known stone.  A 0.038 ZIPwire was advanced into the level of the renal pelvis and the nephrostomy tube exchanged for a KMP type  angle-tipped catheter and using this for directional guidance, the ZIPwire was navigated down to the level of the ureter to the bladder and exchanged for a Super Stiff wire via the KMP catheter.  Next, a 1 cm skin incision was made at the previous  nephrostomy tube site.  Fascia dilated with hemostat and a dual lumen introducer was advanced using fluoroscopic guidance to the level of the proximal ureter.  A 2nd ZIPwire was advanced via this site and exchanged for a 2nd Super Stiff wire via the KMP  catheter.  Having obtained dual Super Stiff access to the level of the bladder 1 of the Super Stiff wires was set aside as a safety wire and the NephroMax balloon dilation apparatus was carefully advanced across the lower pole calix using fluoroscopic  guidance and inflated to a pressure of 20 atmospheres, held for 90 seconds and the sheath was carefully placed across this.  Using fluoroscopic guidance rigid nephroscopy was then performed.  This revealed excellent sheath placement into the lower pole  calix.  No evidence of renal perforation.  This was then direct apposition to multifocal lower pole stone.  Unfortunately, most of the lower pole stone was actually comprised of separate individual stones that were amenable to rigid nephroscopy grasping  technique and the stones were removed, set aside.  The UPJ stone was also navigated into a direct access with the sheath and similarly removed just with rigid nephroscopy.  This was quite favorable.  Flexible nephroscopy was then performed using a  16-French flexible scope and  all accessible calices were explored.  There was a very acutely angled lower pole calix that did harbor additional stone material.  This was amenable to simple basketing with an escape basket.  As the goal today was to render  her completely stone free on the left side a single channel digital ureteroscope was advanced over the Super Stiff working wire using fluoroscopic guidance to the level of the bladder and this was carefully withdrawn under direct vision.  There were 2  areas of stone in the ureter, 1 in the midureter.  This was amenable to simple basketing, removed and set aside, and 1 in the more proximal ureter again, amenable to simple basketing.  Having verified complete resolution of all accessible stone fragments  larger than 1/3 mm, we achieved the goals of the stone removal today.  A ZIPwire was advanced under direct vision to the level of the urinary bladder via the ureteroscope.  The Super Stiff safety wire was removed and using a combination of fluoroscopic  and nephroscopic guidance a 6 x 24 Contour-type stent was placed in antegrade fashion.  Good proximal and distal plane were noted.  Next, using fluoroscopic guidance, 10 mL of FloSeal was applied along the percutaneous tract as the access sheath was  removed and the skin was closed using interrupted Vicryl x3.  The procedure terminated.  The patient tolerated the procedure well.  No immediate perioperative complications.  The patient taken to postanesthesia care in stable condition.  Plan for  observation admission, likely discharge home tomorrow.  CN/NUANCE  D:06/13/2020 T:06/13/2020 JOB:[id number]

## 2020-06-14 ENCOUNTER — Encounter (HOSPITAL_COMMUNITY): Payer: Self-pay | Admitting: Urology

## 2020-06-14 DIAGNOSIS — N202 Calculus of kidney with calculus of ureter: Secondary | ICD-10-CM | POA: Diagnosis not present

## 2020-06-14 DIAGNOSIS — E119 Type 2 diabetes mellitus without complications: Secondary | ICD-10-CM | POA: Diagnosis not present

## 2020-06-14 DIAGNOSIS — Z79899 Other long term (current) drug therapy: Secondary | ICD-10-CM | POA: Diagnosis not present

## 2020-06-14 DIAGNOSIS — Z7982 Long term (current) use of aspirin: Secondary | ICD-10-CM | POA: Diagnosis not present

## 2020-06-14 DIAGNOSIS — I2510 Atherosclerotic heart disease of native coronary artery without angina pectoris: Secondary | ICD-10-CM | POA: Diagnosis not present

## 2020-06-14 DIAGNOSIS — I119 Hypertensive heart disease without heart failure: Secondary | ICD-10-CM | POA: Diagnosis not present

## 2020-06-14 DIAGNOSIS — Z7984 Long term (current) use of oral hypoglycemic drugs: Secondary | ICD-10-CM | POA: Diagnosis not present

## 2020-06-14 LAB — BASIC METABOLIC PANEL
Anion gap: 5 (ref 5–15)
BUN: 16 mg/dL (ref 8–23)
CO2: 25 mmol/L (ref 22–32)
Calcium: 9 mg/dL (ref 8.9–10.3)
Chloride: 104 mmol/L (ref 98–111)
Creatinine, Ser: 0.97 mg/dL (ref 0.44–1.00)
GFR calc Af Amer: >60 mL/min (ref >60)
GFR calc non Af Amer: 59 mL/min — ABNORMAL LOW (ref >60)
Glucose, Bld: 117 mg/dL — ABNORMAL HIGH (ref 70–99)
Potassium: 4.5 mmol/L (ref 3.5–5.1)
Sodium: 134 mmol/L — ABNORMAL LOW (ref 135–145)

## 2020-06-14 LAB — HEMOGLOBIN AND HEMATOCRIT, BLOOD
HCT: 30.3 % — ABNORMAL LOW (ref 36.0–46.0)
Hemoglobin: 9.6 g/dL — ABNORMAL LOW (ref 12.0–15.0)

## 2020-06-14 LAB — GLUCOSE, CAPILLARY
Glucose-Capillary: 85 mg/dL (ref 70–99)
Glucose-Capillary: 108 mg/dL — ABNORMAL HIGH (ref 70–99)

## 2020-06-14 MED ORDER — TRAMADOL HCL 50 MG PO TABS: 50.0000 mg | ORAL_TABLET | Freq: Four times a day (QID) | ORAL | 0 refills | Status: DC | PRN

## 2020-06-14 MED ORDER — CEPHALEXIN 500 MG PO CAPS: 500.0000 mg | ORAL_CAPSULE | Freq: Two times a day (BID) | ORAL | 0 refills | Status: AC

## 2020-06-14 MED ORDER — FLUCONAZOLE 100 MG PO TABS
100.0000 mg | ORAL_TABLET | Freq: Every day | ORAL | 0 refills | Status: AC
Start: 2020-06-14 — End: 2020-06-17

## 2020-06-14 NOTE — Discharge Instructions (Signed)
1 - You may have urinary urgency (bladder spasms) and bloody urine on / off with stent in place. This is normal.  2 - You can shower anytime. Stitches in back are dissolvable and will go away in about 3 weeks. You can keep a dressing on the back for comfort, but no dressing is necessary.   3 - Restart Plavix on Sunday 06/17/20.  4 - Call MD or go to ER for fever >102, severe pain / nausea / vomiting not relieved by medications, or acute change in medical status

## 2020-06-14 NOTE — Discharge Summary (Signed)
Physician Discharge Summary  Patient ID: Claudia Christian MRN: [id number] DOB/AGE: 72-10-1946 72 y.o.  Admit date: 06/13/2020 Discharge date: 06/14/2020  Admission Diagnoses: Left Partial Staghorn Kidney Stone  Discharge Diagnoses:  Active Problems:   Staghorn calculus   Discharged Condition: good  Hospital Course: Pt underwent LEFT percutaneous nephrostolithotomy on 06/13/20, the day of admission, without acute complication. She was admitted to the 4th floor Urology service post-op for observation. By the afternoon of POD 1, she is ambulatory, pain controlled on PO meds, maintaining PO nutrition, had catheter removed, Hgb stable at 9.6 and Cr at 0.97, and felt to be adequate for discharge.   Consults: None  Significant Diagnostic Studies: labs: as per above  Treatments: surgery: as per above  Discharge Exam: Blood pressure 108/66, pulse 65, temperature 98.4 F (36.9 C), temperature source Oral, resp. rate 18, height 5\' 3"  (1.6 m), weight 62.8 kg, SpO2 100 %. General appearance: alert, cooperative and very pleasant, at baseline.  Eyes: negative Nose: Nares normal. Septum midline. Mucosa normal. No drainage or sinus tenderness. Throat: lips, mucosa, and tongue normal; teeth and gums normal Neck: supple, symmetrical, trachea midline Back: symmetric, no curvature. ROM normal. No CVA tenderness. Resp: non-labored on room air.  Cardio: Nl rate GI: soft, non-tender; bowel sounds normal; no masses,  no organomegaly Pelvic: external genitalia normal Pulses: 2+ and symmetric Skin: Skin color, texture, turgor normal. No rashes or lesions Lymph nodes: Cervical, supraclavicular, and axillary nodes normal. Neurologic: Grossly normal  Disposition: HOME   Allergies as of 06/14/2020   No Known Allergies     Medication List    STOP taking these medications   clopidogrel 75 MG tablet Commonly known as: PLAVIX     TAKE these medications   acetaminophen 325 MG tablet Commonly known  as: TYLENOL Take 2 tablets (650 mg total) by mouth every 6 (six) hours as needed for mild pain (or Fever >/= 101).   alendronate 70 MG tablet Commonly known as: FOSAMAX Take 70 mg by mouth once a week. Take with a full glass of water on an empty stomach.   aspirin 81 MG EC tablet Take 1 tablet (81 mg total) by mouth daily.   atorvastatin 40 MG tablet Commonly known as: Lipitor Take 1 tablet (40 mg total) by mouth daily. What changed: when to take this   cephALEXin 500 MG capsule Commonly known as: KEFLEX Take 1 capsule (500 mg total) by mouth 2 (two) times daily for 3 days. Begin day before next Urology appointment.   enalapril 20 MG tablet Commonly known as: VASOTEC Take 20 mg by mouth daily.   ferrous sulfate 325 (65 FE) MG tablet Take 325 mg by mouth daily.   fluconazole 100 MG tablet Commonly known as: Diflucan Take 1 tablet (100 mg total) by mouth daily for 3 days. Begin day before next Urology appointment   fluticasone 50 MCG/ACT nasal spray Commonly known as: FLONASE Place 1-2 sprays into both nostrils daily as needed for allergies or rhinitis.   metFORMIN 1000 MG tablet Commonly known as: GLUCOPHAGE Take 1,000 mg by mouth 2 (two) times daily with a meal.   metoprolol tartrate 25 MG tablet Commonly known as: LOPRESSOR TAKE 1/2 TABLET BY MOUTH TWICE DAILY   nitroGLYCERIN 0.4 MG SL tablet Commonly known as: Nitrostat Place 1 tablet (0.4 mg total) under the tongue every 5 (five) minutes as needed for chest pain (up to 3 doses. If taking 3rd dose call 911).   polyethylene glycol 17 g packet Commonly  known as: MIRALAX / GLYCOLAX Take 17 g by mouth daily as needed for mild constipation.   sertraline 50 MG tablet Commonly known as: ZOLOFT Take 50 mg by mouth daily.   sitaGLIPtin 25 MG tablet Commonly known as: JANUVIA Take 25 mg by mouth at bedtime.   traMADol 50 MG tablet Commonly known as: Ultram Take 1 tablet (50 mg total) by mouth every 6 (six) hours as  needed for moderate pain or severe pain. Post-operatively   vitamin B-12 1000 MCG tablet Commonly known as: CYANOCOBALAMIN Take 1,000 mcg by mouth daily.   Vitamin D-3 125 MCG (5000 UT) Tabs Take 5,000 Units by mouth daily.       Follow-up Information    Sebastian Ache, MD On 07/02/2020.   Specialty: Urology Why: at 8 AM for MD visit and office stent removal.  Contact information: [address] [place] [address] [phone]               Signed: Sebastian Ache 06/14/2020, 2:19 PM

## 2020-06-14 NOTE — Progress Notes (Signed)
Went over discharge with patient and family.  All questions answered.  VSS.  AVS given.  Pt wheeled out via NT.

## 2020-06-15 ENCOUNTER — Telehealth: Payer: Self-pay | Admitting: Cardiology

## 2020-06-15 NOTE — Telephone Encounter (Signed)
Patient is scheduled to have a dental cleaning 06/25/20. She needs to know if she needs to stop her plavix and aspirin at all prior to the cleaning. Please advise

## 2020-06-15 NOTE — Telephone Encounter (Signed)
Spoke to patient and let her know that per Dr. Tomie China she does not need to hold any of her medications before her dental cleaning. She verbalizes understanding and does not have any other questions or concerns.    Encouraged patient to call back with any questions or concerns.

## 2020-06-15 NOTE — Telephone Encounter (Signed)
Do not hold any of these medications continue them in uninterrupted fashion and see the dentist and if he has any questions he can always try to reach [place].

## 2020-06-18 ENCOUNTER — Other Ambulatory Visit: Payer: Self-pay

## 2020-06-18 ENCOUNTER — Emergency Department (HOSPITAL_COMMUNITY)
Admission: EM | Admit: 2020-06-18 | Discharge: 2020-06-19 | Disposition: A | Discharge status: Home / Self Care | Payer: Medicare PPO | Source: Home / Self Care

## 2020-06-18 ENCOUNTER — Encounter (HOSPITAL_COMMUNITY): Payer: Self-pay | Admitting: Emergency Medicine

## 2020-06-18 DIAGNOSIS — N1330 Unspecified hydronephrosis: Secondary | ICD-10-CM | POA: Diagnosis not present

## 2020-06-18 DIAGNOSIS — Z5321 Procedure and treatment not carried out due to patient leaving prior to being seen by health care provider: Secondary | ICD-10-CM | POA: Insufficient documentation

## 2020-06-18 DIAGNOSIS — N99820 Postprocedural hemorrhage and hematoma of a genitourinary system organ or structure following a genitourinary system procedure: Secondary | ICD-10-CM | POA: Diagnosis not present

## 2020-06-18 DIAGNOSIS — R1032 Left lower quadrant pain: Secondary | ICD-10-CM | POA: Insufficient documentation

## 2020-06-18 DIAGNOSIS — I700 Atherosclerosis of aorta: Secondary | ICD-10-CM | POA: Diagnosis not present

## 2020-06-18 DIAGNOSIS — R319 Hematuria, unspecified: Secondary | ICD-10-CM | POA: Insufficient documentation

## 2020-06-18 DIAGNOSIS — N200 Calculus of kidney: Secondary | ICD-10-CM | POA: Diagnosis not present

## 2020-06-18 DIAGNOSIS — G8918 Other acute postprocedural pain: Secondary | ICD-10-CM | POA: Diagnosis not present

## 2020-06-18 DIAGNOSIS — K5730 Diverticulosis of large intestine without perforation or abscess without bleeding: Secondary | ICD-10-CM | POA: Diagnosis not present

## 2020-06-18 LAB — CBC
HCT: 35 % — ABNORMAL LOW (ref 36.0–46.0)
Hemoglobin: 11.7 g/dL — ABNORMAL LOW (ref 12.0–15.0)
MCH: 28.7 pg (ref 26.0–34.0)
MCHC: 33.4 g/dL (ref 30.0–36.0)
MCV: 85.8 fL (ref 80.0–100.0)
Platelets: 298 10*3/uL (ref 150–400)
RBC: 4.08 MIL/uL (ref 3.87–5.11)
RDW: 12.9 % (ref 11.5–15.5)
WBC: 17.3 10*3/uL — ABNORMAL HIGH (ref 4.0–10.5)
nRBC: 0 % (ref 0.0–0.2)

## 2020-06-18 LAB — URINALYSIS, ROUTINE W REFLEX MICROSCOPIC
Bilirubin Urine: NEGATIVE
Glucose, UA: 50 mg/dL — AB
Ketones, ur: NEGATIVE mg/dL
Nitrite: NEGATIVE
Protein, ur: 100 mg/dL — AB
RBC / HPF: >50 RBC/hpf — ABNORMAL HIGH (ref 0–5)
Specific Gravity, Urine: 1.009 (ref 1.005–1.030)
pH: 7 (ref 5.0–8.0)

## 2020-06-18 MED ORDER — SODIUM CHLORIDE 0.9% FLUSH: 3.0000 mL | Freq: Once | INTRAVENOUS | Status: DC

## 2020-06-18 NOTE — ED Triage Notes (Signed)
Patient presents with left lower abdominal pain as well as hematuria which started this evening. Patient had Kidney stones removed this past Wednesday. She began taking her lasix again on sunday.

## 2020-06-18 NOTE — ED Notes (Signed)
Patient bled through the dressing on her lower left back. Dressing reinforced.

## 2020-06-19 ENCOUNTER — Other Ambulatory Visit: Payer: Self-pay

## 2020-06-19 ENCOUNTER — Inpatient Hospital Stay (HOSPITAL_COMMUNITY)
Admission: EM | Admit: 2020-06-19 | Discharge: 2020-06-22 | DRG: 920 | Disposition: A | Discharge status: Home / Self Care | Payer: Medicare PPO | Attending: Urology | Admitting: Urology

## 2020-06-19 ENCOUNTER — Encounter (HOSPITAL_COMMUNITY): Payer: Self-pay | Admitting: Emergency Medicine

## 2020-06-19 ENCOUNTER — Emergency Department (HOSPITAL_COMMUNITY): Payer: Medicare PPO

## 2020-06-19 DIAGNOSIS — Z20822 Contact with and (suspected) exposure to covid-19: Secondary | ICD-10-CM | POA: Diagnosis present

## 2020-06-19 DIAGNOSIS — Z8249 Family history of ischemic heart disease and other diseases of the circulatory system: Secondary | ICD-10-CM

## 2020-06-19 DIAGNOSIS — G8918 Other acute postprocedural pain: Secondary | ICD-10-CM

## 2020-06-19 DIAGNOSIS — Z7984 Long term (current) use of oral hypoglycemic drugs: Secondary | ICD-10-CM

## 2020-06-19 DIAGNOSIS — Z9851 Tubal ligation status: Secondary | ICD-10-CM

## 2020-06-19 DIAGNOSIS — N202 Calculus of kidney with calculus of ureter: Secondary | ICD-10-CM | POA: Diagnosis not present

## 2020-06-19 DIAGNOSIS — K5730 Diverticulosis of large intestine without perforation or abscess without bleeding: Secondary | ICD-10-CM | POA: Diagnosis not present

## 2020-06-19 DIAGNOSIS — Z955 Presence of coronary angioplasty implant and graft: Secondary | ICD-10-CM

## 2020-06-19 DIAGNOSIS — Z7982 Long term (current) use of aspirin: Secondary | ICD-10-CM

## 2020-06-19 DIAGNOSIS — Z79899 Other long term (current) drug therapy: Secondary | ICD-10-CM

## 2020-06-19 DIAGNOSIS — N1330 Unspecified hydronephrosis: Secondary | ICD-10-CM | POA: Diagnosis present

## 2020-06-19 DIAGNOSIS — N200 Calculus of kidney: Secondary | ICD-10-CM | POA: Diagnosis not present

## 2020-06-19 DIAGNOSIS — I10 Essential (primary) hypertension: Secondary | ICD-10-CM | POA: Diagnosis present

## 2020-06-19 DIAGNOSIS — E119 Type 2 diabetes mellitus without complications: Secondary | ICD-10-CM | POA: Diagnosis present

## 2020-06-19 DIAGNOSIS — R319 Hematuria, unspecified: Secondary | ICD-10-CM | POA: Diagnosis present

## 2020-06-19 DIAGNOSIS — E785 Hyperlipidemia, unspecified: Secondary | ICD-10-CM | POA: Diagnosis present

## 2020-06-19 DIAGNOSIS — N99820 Postprocedural hemorrhage and hematoma of a genitourinary system organ or structure following a genitourinary system procedure: Principal | ICD-10-CM | POA: Diagnosis present

## 2020-06-19 DIAGNOSIS — I2510 Atherosclerotic heart disease of native coronary artery without angina pectoris: Secondary | ICD-10-CM | POA: Diagnosis present

## 2020-06-19 DIAGNOSIS — Y838 Other surgical procedures as the cause of abnormal reaction of the patient, or of later complication, without mention of misadventure at the time of the procedure: Secondary | ICD-10-CM | POA: Diagnosis present

## 2020-06-19 DIAGNOSIS — Z7983 Long term (current) use of bisphosphonates: Secondary | ICD-10-CM

## 2020-06-19 DIAGNOSIS — Z87442 Personal history of urinary calculi: Secondary | ICD-10-CM

## 2020-06-19 DIAGNOSIS — I700 Atherosclerosis of aorta: Secondary | ICD-10-CM | POA: Diagnosis not present

## 2020-06-19 DIAGNOSIS — I252 Old myocardial infarction: Secondary | ICD-10-CM

## 2020-06-19 HISTORY — DX: Hematuria, unspecified: R31.9

## 2020-06-19 LAB — CBC WITH DIFFERENTIAL/PLATELET
Abs Immature Granulocytes: 0.77 10*3/uL — ABNORMAL HIGH (ref 0.00–0.07)
Basophils Absolute: 0.1 10*3/uL (ref 0.0–0.1)
Basophils Relative: 0 %
Eosinophils Absolute: 0 10*3/uL (ref 0.0–0.5)
Eosinophils Relative: 0 %
HCT: 35.1 % — ABNORMAL LOW (ref 36.0–46.0)
Hemoglobin: 11.6 g/dL — ABNORMAL LOW (ref 12.0–15.0)
Immature Granulocytes: 4 %
Lymphocytes Relative: 5 %
Lymphs Abs: 1 10*3/uL (ref 0.7–4.0)
MCH: 28.3 pg (ref 26.0–34.0)
MCHC: 33 g/dL (ref 30.0–36.0)
MCV: 85.6 fL (ref 80.0–100.0)
Monocytes Absolute: 0.8 10*3/uL (ref 0.1–1.0)
Monocytes Relative: 4 %
Neutro Abs: 18.7 10*3/uL — ABNORMAL HIGH (ref 1.7–7.7)
Neutrophils Relative %: 87 %
Platelets: 326 10*3/uL (ref 150–400)
RBC: 4.1 MIL/uL (ref 3.87–5.11)
RDW: 13.1 % (ref 11.5–15.5)
WBC: 21.3 10*3/uL — ABNORMAL HIGH (ref 4.0–10.5)
nRBC: 0 % (ref 0.0–0.2)

## 2020-06-19 LAB — COMPREHENSIVE METABOLIC PANEL
ALT: 24 U/L (ref 0–44)
AST: 18 U/L (ref 15–41)
Albumin: 4.2 g/dL (ref 3.5–5.0)
Alkaline Phosphatase: 57 U/L (ref 38–126)
Anion gap: 12 (ref 5–15)
BUN: 19 mg/dL (ref 8–23)
CO2: 22 mmol/L (ref 22–32)
Calcium: 9.7 mg/dL (ref 8.9–10.3)
Chloride: 94 mmol/L — ABNORMAL LOW (ref 98–111)
Creatinine, Ser: 0.92 mg/dL (ref 0.44–1.00)
GFR calc Af Amer: >60 mL/min (ref >60)
GFR calc non Af Amer: >60 mL/min (ref >60)
Glucose, Bld: 234 mg/dL — ABNORMAL HIGH (ref 70–99)
Potassium: 4.6 mmol/L (ref 3.5–5.1)
Sodium: 128 mmol/L — ABNORMAL LOW (ref 135–145)
Total Bilirubin: 0.6 mg/dL (ref 0.3–1.2)
Total Protein: 7.6 g/dL (ref 6.5–8.1)

## 2020-06-19 LAB — URINALYSIS, ROUTINE W REFLEX MICROSCOPIC
Bilirubin Urine: NEGATIVE
Glucose, UA: NEGATIVE mg/dL
Ketones, ur: NEGATIVE mg/dL
Nitrite: NEGATIVE
Protein, ur: 100 mg/dL — AB
RBC / HPF: >50 RBC/hpf — ABNORMAL HIGH (ref 0–5)
Specific Gravity, Urine: 1.024 (ref 1.005–1.030)
WBC, UA: >50 WBC/hpf — ABNORMAL HIGH (ref 0–5)
pH: 5 (ref 5.0–8.0)

## 2020-06-19 LAB — TYPE AND SCREEN
ABO/RH(D): O POS
Antibody Screen: NEGATIVE

## 2020-06-19 LAB — BASIC METABOLIC PANEL
Anion gap: 17 — ABNORMAL HIGH (ref 5–15)
BUN: 23 mg/dL (ref 8–23)
CO2: 20 mmol/L — ABNORMAL LOW (ref 22–32)
Calcium: 9.4 mg/dL (ref 8.9–10.3)
Chloride: 89 mmol/L — ABNORMAL LOW (ref 98–111)
Creatinine, Ser: 1.22 mg/dL — ABNORMAL HIGH (ref 0.44–1.00)
GFR calc Af Amer: 52 mL/min — ABNORMAL LOW (ref >60)
GFR calc non Af Amer: 45 mL/min — ABNORMAL LOW (ref >60)
Glucose, Bld: 204 mg/dL — ABNORMAL HIGH (ref 70–99)
Potassium: 4.8 mmol/L (ref 3.5–5.1)
Sodium: 126 mmol/L — ABNORMAL LOW (ref 135–145)

## 2020-06-19 LAB — LIPASE, BLOOD: Lipase: 30 U/L (ref 11–51)

## 2020-06-19 LAB — SARS CORONAVIRUS 2 BY RT PCR (HOSPITAL ORDER, PERFORMED IN ~~LOC~~ HOSPITAL LAB): SARS Coronavirus 2: NEGATIVE

## 2020-06-19 MED ORDER — OXYBUTYNIN CHLORIDE 5 MG PO TABS: 5.0000 mg | ORAL_TABLET | Freq: Three times a day (TID) | ORAL | Status: DC | PRN

## 2020-06-19 MED ORDER — DIPHENHYDRAMINE HCL 50 MG/ML IJ SOLN: 12.5000 mg | Freq: Four times a day (QID) | INTRAMUSCULAR | Status: DC | PRN

## 2020-06-19 MED ORDER — ONDANSETRON HCL 4 MG/2ML IJ SOLN: 4.0000 mg | INTRAMUSCULAR | Status: DC | PRN

## 2020-06-19 MED ORDER — ASPIRIN EC 81 MG PO TBEC
81.0000 mg | DELAYED_RELEASE_TABLET | Freq: Every day | ORAL | Status: DC
  Administered 2020-06-20 – 2020-06-22 (×3): 81 mg via ORAL
  Filled 2020-06-19 (×3): qty 1

## 2020-06-19 MED ORDER — MORPHINE SULFATE (PF) 2 MG/ML IV SOLN: 2.0000 mg | INTRAVENOUS | Status: DC | PRN

## 2020-06-19 MED ORDER — FERROUS SULFATE 325 (65 FE) MG PO TABS
325.0000 mg | ORAL_TABLET | Freq: Every day | ORAL | Status: DC
  Administered 2020-06-20 – 2020-06-22 (×3): 325 mg via ORAL
  Filled 2020-06-19 (×3): qty 1

## 2020-06-19 MED ORDER — METOPROLOL TARTRATE 12.5 MG HALF TABLET
12.5000 mg | ORAL_TABLET | Freq: Two times a day (BID) | ORAL | Status: DC
  Administered 2020-06-19 – 2020-06-22 (×6): 12.5 mg via ORAL
  Filled 2020-06-19 (×6): qty 1

## 2020-06-19 MED ORDER — SODIUM CHLORIDE 0.9 % IV SOLN: INTRAVENOUS | Status: DC

## 2020-06-19 MED ORDER — IOHEXOL 350 MG/ML SOLN
100.0000 mL | Freq: Once | INTRAVENOUS | Status: AC | PRN
  Administered 2020-06-19: 80 mL via INTRAVENOUS

## 2020-06-19 MED ORDER — SERTRALINE HCL 50 MG PO TABS
50.0000 mg | ORAL_TABLET | Freq: Every day | ORAL | Status: DC
  Administered 2020-06-20 – 2020-06-22 (×3): 50 mg via ORAL
  Filled 2020-06-19 (×3): qty 1

## 2020-06-19 MED ORDER — FLUTICASONE PROPIONATE 50 MCG/ACT NA SUSP: 1.0000 | Freq: Every day | NASAL | Status: DC | PRN

## 2020-06-19 MED ORDER — VITAMIN B-12 1000 MCG PO TABS
1000.0000 ug | ORAL_TABLET | Freq: Every day | ORAL | Status: DC
  Administered 2020-06-20 – 2020-06-22 (×3): 1000 ug via ORAL
  Filled 2020-06-19 (×4): qty 1

## 2020-06-19 MED ORDER — POLYETHYLENE GLYCOL 3350 17 G PO PACK: 17.0000 g | PACK | Freq: Every day | ORAL | Status: DC | PRN

## 2020-06-19 MED ORDER — ENALAPRIL MALEATE 10 MG PO TABS
20.0000 mg | ORAL_TABLET | Freq: Every day | ORAL | Status: DC
  Administered 2020-06-20 – 2020-06-22 (×3): 20 mg via ORAL
  Filled 2020-06-19 (×3): qty 2

## 2020-06-19 MED ORDER — DOCUSATE SODIUM 100 MG PO CAPS
100.0000 mg | ORAL_CAPSULE | Freq: Two times a day (BID) | ORAL | Status: DC
  Administered 2020-06-19 – 2020-06-22 (×6): 100 mg via ORAL
  Filled 2020-06-19 (×6): qty 1

## 2020-06-19 MED ORDER — SENNA 8.6 MG PO TABS
1.0000 | ORAL_TABLET | Freq: Two times a day (BID) | ORAL | Status: DC
  Administered 2020-06-19 – 2020-06-22 (×6): 8.6 mg via ORAL
  Filled 2020-06-19 (×6): qty 1

## 2020-06-19 MED ORDER — ACETAMINOPHEN 325 MG PO TABS: 650.0000 mg | ORAL_TABLET | ORAL | Status: DC | PRN

## 2020-06-19 MED ORDER — DIPHENHYDRAMINE HCL 12.5 MG/5ML PO ELIX: 12.5000 mg | ORAL_SOLUTION | Freq: Four times a day (QID) | ORAL | Status: DC | PRN

## 2020-06-19 MED ORDER — ZOLPIDEM TARTRATE 5 MG PO TABS
5.0000 mg | ORAL_TABLET | Freq: Every evening | ORAL | Status: DC | PRN
  Filled 2020-06-19: qty 1

## 2020-06-19 MED ORDER — INSULIN ASPART 100 UNIT/ML ~~LOC~~ SOLN
0.0000 [IU] | Freq: Three times a day (TID) | SUBCUTANEOUS | Status: DC
  Administered 2020-06-20: 3 [IU] via SUBCUTANEOUS
  Administered 2020-06-20: 2 [IU] via SUBCUTANEOUS
  Administered 2020-06-22: 5 [IU] via SUBCUTANEOUS
  Administered 2020-06-22: 2 [IU] via SUBCUTANEOUS
  Filled 2020-06-19: qty 0.15

## 2020-06-19 MED ORDER — NITROGLYCERIN 0.4 MG SL SUBL: 0.4000 mg | SUBLINGUAL_TABLET | SUBLINGUAL | Status: DC | PRN

## 2020-06-19 MED ORDER — ATORVASTATIN CALCIUM 40 MG PO TABS
40.0000 mg | ORAL_TABLET | Freq: Every evening | ORAL | Status: DC
  Administered 2020-06-19 – 2020-06-21 (×3): 40 mg via ORAL
  Filled 2020-06-19 (×3): qty 1

## 2020-06-19 MED ORDER — BELLADONNA ALKALOIDS-OPIUM 16.2-60 MG RE SUPP: 1.0000 | Freq: Four times a day (QID) | RECTAL | Status: DC | PRN

## 2020-06-19 MED ORDER — OXYCODONE HCL 5 MG PO TABS: 5.0000 mg | ORAL_TABLET | ORAL | Status: DC | PRN

## 2020-06-19 NOTE — ED Provider Notes (Signed)
[facility]-EMERGENCY DEPT Provider Note   CSN: [id number] Arrival date & time: 06/19/20  1508     History Chief Complaint  Patient presents with  . Post-op Problem    Claudia Christian is a 74 y.o. female.  CAD, diabetes, hypertension, hyperlipidemia, nephrolithiasis.  Recent procedure by Dr. Urban Gibson for Left percutaneous nephrostolithotomy, Placement of left ureteral stent.  Patient reports initially post procedure she was doing quite well, then yesterday she started having bleeding in urine as well as bleeding from the site on her back.  States yesterday she was soaking through multiple dressings.  Bleeding seems to have improved somewhat today, had significant pain at site yesterday but this seems to have improved today as well.  No fever.  HPI     Past Medical History:  Diagnosis Date  . Coronary artery disease   . Diabetes mellitus type 2, controlled (HCC) 2016  . Essential hypertension   . History of kidney stones   . Hyperlipidemia   . Myocardial infarction (HCC) 11/2019  . Nephrolithiasis     Patient Active Problem List   Diagnosis Date Noted  . Hematuria 06/19/2020  . 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 Essential hypertension 12/02/2019  . CAD (coronary artery disease) 11/28/2019  . Coronary arteriosclerosis 11/28/2019  . Anemia 11/28/2019  . Nephrolithiasis   . Sepsis (HCC) 11/15/2019  . Hydronephrosis with renal and ureteral calculus obstruction 11/15/2019  . Hyponatremia 11/15/2019  . History of non-ST elevation myocardial infarction (NSTEMI) 11/15/2019  . Type 2 diabetes mellitus with hyperlipidemia (HCC) 11/15/2019    Past Surgical History:  Procedure Laterality Date  . CARDIAC CATHETERIZATION    . CORONARY BALLOON ANGIOPLASTY N/A 12/02/2019   Procedure: CORONARY BALLOON ANGIOPLASTY;  Surgeon: Corky Crafts, MD;  Location: [facility] INVASIVE CV LAB;  Service: Cardiovascular;  Laterality: N/A;  . CORONARY STENT INTERVENTION N/A 12/02/2019    Procedure: CORONARY STENT INTERVENTION;  Surgeon: Corky Crafts, MD;  Location: [facility] INVASIVE CV LAB;  Service: Cardiovascular;  Laterality: N/A;  . INTRAVASCULAR ULTRASOUND/IVUS N/A 12/02/2019   Procedure: Intravascular Ultrasound/IVUS;  Surgeon: Corky Crafts, MD;  Location[facility] CV LAB;  Service: Cardiovascular;  Laterality: N/A;  . IR NEPHROSTOMY EXCHANGE LEFT  12/28/2019  . IR NEPHROSTOMY EXCHANGE LEFT  02/09/2020  . IR NEPHROSTOMY EXCHANGE LEFT  03/22/2020  . IR NEPHROSTOMY EXCHANGE LEFT  05/03/2020  . IR NEPHROSTOMY PLACEMENT LEFT  11/16/2019  . LEFT HEART CATH AND CORONARY ANGIOGRAPHY N/A 12/02/2019   Procedure: LEFT HEART CATH AND CORONARY ANGIOGRAPHY;  Surgeon: Corky Crafts, MD;  Location: [facility] INVASIVE CV LAB;  Service: Cardiovascular;  Laterality: N/A;  . LITHOTRIPSY    . NEPHROLITHOTOMY Left 06/13/2020   Procedure: NEPHROLITHOTOMY PERCUTANEOUS;  Surgeon: Sebastian Ache, MD;  Location: [facility] ORS;  Service: Urology;  Laterality: Left;  3 HRS  . TUBAL LIGATION  1984  . URETEROSCOPY Left 06/13/2020   Procedure: URETEROSCOPY WITH BASKETING AND STENT PLACEMENT;  Surgeon: Sebastian Ache, MD;  Location: [facility] ORS;  Service: Urology;  Laterality: Left;     OB History   No obstetric history on file.     Family History  Problem Relation Age of Onset  . Heart attack Mother   . Heart attack Father   . Heart attack Brother     Social History   Tobacco Use  . Smoking status: Never Smoker  . Smokeless tobacco: Never Used  Vaping Use  . Vaping Use: Never used  Substance Use Topics  . Alcohol use:  Never  . Drug use: Never    Home Medications Prior to Admission medications   Medication Sig Start Date End Date Taking? Authorizing Provider  acetaminophen (TYLENOL) 325 MG tablet Take 2 tablets (650 mg total) by mouth every 6 (six) hours as needed for mild pain (or Fever >/= 101). 11/19/19  Yes Sunnie Nielsen, DO  alendronate (FOSAMAX) 70 MG tablet Take 70 mg by  mouth once a week. Take with a full glass of water on an empty stomach.    Yes [provider]  aspirin EC 81 MG EC tablet Take 1 tablet (81 mg total) by mouth daily. 11/20/19  Yes Sunnie Nielsen, DO  atorvastatin (LIPITOR) 40 MG tablet Take 1 tablet (40 mg total) by mouth daily. Patient taking differently: Take 40 mg by mouth every evening.  11/19/19 11/18/20 Yes Sunnie Nielsen, DO  Cholecalciferol (VITAMIN D-3) 125 MCG (5000 UT) TABS Take 5,000 Units by mouth daily.   Yes [provider]  enalapril (VASOTEC) 20 MG tablet Take 20 mg by mouth daily.   Yes [provider]  ferrous sulfate 325 (65 FE) MG tablet Take 325 mg by mouth daily.   Yes [provider]  fluticasone (FLONASE) 50 MCG/ACT nasal spray Place 1-2 sprays into both nostrils daily as needed for allergies or rhinitis.   Yes [provider]  metFORMIN (GLUCOPHAGE) 1000 MG tablet Take 1,000 mg by mouth 2 (two) times daily with a meal.   Yes [provider]  metoprolol tartrate (LOPRESSOR) 25 MG tablet TAKE 1/2 TABLET BY MOUTH TWICE DAILY Patient taking differently: Take 12.5 mg by mouth 2 (two) times daily.  03/16/20  Yes Revankar, Aundra Dubin, MD  polyethylene glycol (MIRALAX / GLYCOLAX) 17 g packet Take 17 g by mouth daily as needed for mild constipation. 11/19/19  Yes Sunnie Nielsen, DO  sertraline (ZOLOFT) 50 MG tablet Take 50 mg by mouth daily.   Yes [provider]  sitaGLIPtin (JANUVIA) 25 MG tablet Take 25 mg by mouth at bedtime.    Yes [provider]  traMADol (ULTRAM) 50 MG tablet Take 1 tablet (50 mg total) by mouth every 6 (six) hours as needed for moderate pain or severe pain. Post-operatively 06/14/20 06/14/21 Yes Sebastian Ache, MD  vitamin B-12 (CYANOCOBALAMIN) 1000 MCG tablet Take 1,000 mcg by mouth daily.   Yes [provider]  nitroGLYCERIN (NITROSTAT) 0.4 MG SL tablet Place 1 tablet (0.4 mg total) under the tongue every 5 (five) minutes as  needed for chest pain (up to 3 doses. If taking 3rd dose call 911). 12/02/19 12/01/20  Laurann Montana, PA-C    Allergies    Patient has no known allergies.  Review of Systems   Review of Systems  Constitutional: Negative for chills and fever.  HENT: Negative for ear pain and sore throat.   Eyes: Negative for pain and visual disturbance.  Respiratory: Negative for cough and shortness of breath.   Cardiovascular: Negative for chest pain and palpitations.  Gastrointestinal: Negative for abdominal pain and vomiting.  Genitourinary: Positive for hematuria. Negative for dysuria.  Musculoskeletal: Positive for back pain. Negative for arthralgias.  Skin: Negative for color change and rash.  Neurological: Negative for seizures and syncope.  All other systems reviewed and are negative.   Physical Exam Updated Vital Signs BP 107/60   Pulse 97   Temp 99.9 F (37.7 C)   Resp (!) 23   SpO2 97%   Physical Exam Vitals and nursing note reviewed.  Constitutional:  General: She is not in acute distress.    Appearance: She is well-developed.  HENT:     Head: Normocephalic and atraumatic.  Eyes:     Conjunctiva/sclera: Conjunctivae normal.  Cardiovascular:     Rate and Rhythm: Normal rate and regular rhythm.     Heart sounds: No murmur heard.   Pulmonary:     Effort: Pulmonary effort is normal. No respiratory distress.     Breath sounds: Normal breath sounds.  Abdominal:     Palpations: Abdomen is soft.     Tenderness: There is no abdominal tenderness.  Musculoskeletal:     Cervical back: Neck supple.     Comments: Left flank dressing is C/D/I, there is some dried blood over dressing, no active bleeding noted  Skin:    General: Skin is warm and dry.  Neurological:     Mental Status: She is alert.     ED Results / Procedures / Treatments   Labs (all labs ordered are listed, but only abnormal results are displayed) Labs Reviewed  BASIC METABOLIC PANEL - Abnormal; Notable for  the following components:      Result Value   Sodium 126 (*)    Chloride 89 (*)    CO2 20 (*)    Glucose, Bld 204 (*)    Creatinine, Ser 1.22 (*)    GFR calc non Af Amer 45 (*)    GFR calc Af Amer 52 (*)    Anion gap 17 (*)    All other components within normal limits  CBC WITH DIFFERENTIAL/PLATELET - Abnormal; Notable for the following components:   WBC 21.3 (*)    Hemoglobin 11.6 (*)    HCT 35.1 (*)    Neutro Abs 18.7 (*)    Abs Immature Granulocytes 0.77 (*)    All other components within normal limits  URINALYSIS, ROUTINE W REFLEX MICROSCOPIC - Abnormal; Notable for the following components:   APPearance CLOUDY (*)    Hgb urine dipstick LARGE (*)    Protein, ur 100 (*)    Leukocytes,Ua LARGE (*)    RBC / HPF >50 (*)    WBC, UA >50 (*)    Bacteria, UA FEW (*)    All other components within normal limits  SARS CORONAVIRUS 2 BY RT PCR (HOSPITAL ORDER, PERFORMED IN [facility])  BASIC METABOLIC PANEL  HEMOGLOBIN AND HEMATOCRIT, BLOOD  TYPE AND SCREEN  ABO/RH    EKG None  Radiology CT Angio Abd/Pel W and/or Wo Contrast  Result Date: 06/19/2020 CLINICAL DATA:  74 year old female with left-sided percutaneous nephrolithotomy and stent placement. Hematuria. EXAM: CTA ABDOMEN AND PELVIS WITHOUT AND WITH CONTRAST TECHNIQUE: Multidetector CT imaging of the abdomen and pelvis was performed using the standard protocol during bolus administration of intravenous contrast. Multiplanar reconstructed images and MIPs were obtained and reviewed to evaluate the vascular anatomy. CONTRAST:  74mL OMNIPAQUE IOHEXOL 350 MG/ML SOLN COMPARISON:  CT abdomen pelvis dated 11/14/2019. FINDINGS: VASCULAR Aorta: Moderate atherosclerotic calcification of the abdominal aorta. No aneurysmal dilatation or dissection. No periaortic stranding or fluid. Celiac: The celiac axis is patent. SMA: The SMA is patent. There is a replaced hepatic artery from the SMA. Renals: Atherosclerotic calcification of  the origins of the renal arteries. The renal arteries are patent. IMA: The IMA is patent. Inflow: Atherosclerotic calcification of the iliac arteries. The iliac arteries are patent. No aneurysmal dilatation or dissection. Proximal Outflow: Bilateral common femoral and visualized portions of the superficial and profunda femoral arteries are  patent without evidence of aneurysm, dissection, vasculitis or significant stenosis. Veins: The SMV, splenic vein, and main portal vein are patent. No portal venous gas. The IVC is unremarkable. Review of the MIP images confirms the above findings. NON-VASCULAR Lower chest: The visualized lung bases are clear. Advanced coronary vascular calcification involving the LAD. No intra-abdominal free air. Small free fluid in the pelvis. Hepatobiliary: The liver is unremarkable. No calcified gallstone or pericholecystic fluid. No biliary ductal dilatation. Pancreas: Unremarkable. No pancreatic ductal dilatation or surrounding inflammatory changes. Spleen: Normal in size without focal abnormality. Adrenals/Urinary Tract: Indeterminate 2 cm left adrenal nodule. The right adrenal glands are unremarkable. There is a left ureteral stent with proximal tip in the left renal pelvis and distal and within the urinary bladder. Small calcific foci within the left renal collecting system, likely residual stone fragments. No stone identified along the course of the left ureter. There is mild left hydronephrosis similar or slightly decreased since the study of 11/14/2019. There is high attenuating content within the left renal collecting system on precontrast images consistent with blood product or proteinaceous content. No significant change in the attenuation of the contents of the collecting system on post-contrast images. No evidence of active bleed. Focal area of parenchymal defect along the posterior aspect of the lower pole secondary to recent procedure. There is mild left perinephric stranding,  likely related to recent procedure. Correlation with urinalysis recommended to exclude UTI. No fluid collection or abscess. Subcentimeter right renal hypodense focus is too small to characterize. There is no hydronephrosis or nephrolithiasis on the right. The right ureter is unremarkable. The urinary bladder appears unremarkable as well. Stomach/Bowel: There is sigmoid diverticulosis without active inflammatory changes. There is no bowel obstruction or active inflammation. The appendix is not visualized with certainty. No inflammatory changes identified in the right lower quadrant. Lymphatic: No adenopathy. Reproductive: The uterus is anteverted and grossly unremarkable. No adnexal masses. Other: Small amount of fluid along the left paracolic gutter extending to the pelvis, likely related to recent procedure. No large collection. Musculoskeletal: Degenerative changes of the spine. No acute osseous pathology. IMPRESSION: 1. Status post percutaneous nephrolithotomy and placement of a left ureteral stent. No evidence of active bleed. 2. Mild left hydronephrosis with high attenuating content within the left renal collecting system, likely hemorrhagic or proteinaceous content. Several small residual stones remain in the left renal collecting system. 3. Sigmoid diverticulosis. No bowel obstruction. 4. Aortic Atherosclerosis (ICD10-I70.0). Electronically Signed   By: Elgie Collard M.D.   On: 06/19/2020 19:16    Procedures Procedures (including critical care time)  Medications Ordered in ED Medications  aspirin EC tablet 81 mg (has no administration in time range)  atorvastatin (LIPITOR) tablet 40 mg (40 mg Oral Given 06/19/20 2142)  enalapril (VASOTEC) tablet 20 mg (has no administration in time range)  ferrous sulfate tablet 325 mg (325 mg Oral Not Given 06/19/20 2126)  fluticasone (FLONASE) 50 MCG/ACT nasal spray 1-2 spray (has no administration in time range)  metoprolol tartrate (LOPRESSOR) tablet 12.5 mg  (12.5 mg Oral Given 06/19/20 2143)  nitroGLYCERIN (NITROSTAT) SL tablet 0.4 mg (has no administration in time range)  polyethylene glycol (MIRALAX / GLYCOLAX) packet 17 g (has no administration in time range)  sertraline (ZOLOFT) tablet 50 mg (50 mg Oral Not Given 06/19/20 2125)  vitamin B-12 (CYANOCOBALAMIN) tablet 1,000 mcg (1,000 mcg Oral Not Given 06/19/20 2125)  insulin aspart (novoLOG) injection 0-15 Units (has no administration in time range)  0.9 %  sodium chloride  infusion ( Intravenous New Bag/Given 06/19/20 2130)  acetaminophen (TYLENOL) tablet 650 mg (has no administration in time range)  oxyCODONE (Oxy IR/ROXICODONE) immediate release tablet 5 mg (has no administration in time range)  morphine 2 MG/ML injection 2-4 mg (has no administration in time range)  oxybutynin (DITROPAN) tablet 5 mg (has no administration in time range)  opium-belladonna (B&O) suppository 16.2-60mg  (has no administration in time range)  zolpidem (AMBIEN) tablet 5 mg (has no administration in time range)  diphenhydrAMINE (BENADRYL) injection 12.5 mg (has no administration in time range)    Or  diphenhydrAMINE (BENADRYL) 12.5 MG/5ML elixir 12.5 mg (has no administration in time range)  docusate sodium (COLACE) capsule 100 mg (100 mg Oral Given 06/19/20 2142)  senna (SENOKOT) tablet 8.6 mg (8.6 mg Oral Given 06/19/20 2142)  ondansetron (ZOFRAN) injection 4 mg (has no administration in time range)  iohexol (OMNIPAQUE) 350 MG/ML injection 100 mL (80 mLs Intravenous Contrast Given 06/19/20 1810)    ED Course  I have reviewed the triage vital signs and the nursing notes.  Pertinent labs & imaging results that were available during my care of the patient were reviewed by me and considered in my medical decision making (see chart for details).    MDM Rules/Calculators/A&P                           74 year old lady recent procedure for left kidney stones came in with hematuria and bleeding from procedure site.  On exam  patient well-appearing with stable vital signs.  Urology came to bedside and evaluated patient, they recommended admission for observation, Foley catheter placement.  They requested CT to further evaluate, rule out any active bleed.   Dr. Alvester Morin accepting.   Final Clinical Impression(s) / ED Diagnoses Final diagnoses:  Post-operative pain  Hematuria, unspecified type    Rx / DC Orders ED Discharge Orders    None       Milagros Loll, MD 06/19/20 2245

## 2020-06-19 NOTE — H&P (Signed)
H&P  Chief Complaint: Bloody drainage from nephrostomy tube site, gross hematuria  History of Present Illness: 74 year old female status post left percutaneous nephrolithotomy with left ureteroscopy with stone basketing, placement of left ureteral stent.  The patient was discharged following day after an uneventful night.  However, since then, she is presented to the emergency department yesterday with copious drainage from her percutaneous site.  She also had gross hematuria.  She presented to clinic today and was noted to have a large amount of bloody drainage from her nephrostomy site as well as gross hematuria.  She was reportedly having light blood pressure and mild tachycardia.  Therefore, she was sent to the emergency department.  Labs were stable there with a hemoglobin of 11.6 from 11.7 yesterday.  Creatinine 1.22.  CTA of the abdomen and pelvis was performed that showed the left ureteral stent was in good place.  There was some mild left hydronephrosis with high attenuating content within the collecting system likely blood product from her recent surgery.  Patient presently has minimal complaints.  No pain.  She does have some intermittent left-sided pain consistent with intermittent renal colic.  She has been voiding.  Past Medical History:  Diagnosis Date  . Coronary artery disease   . Diabetes mellitus type 2, controlled (HCC) 2016  . Essential hypertension   . History of kidney stones   . Hyperlipidemia   . Myocardial infarction (HCC) 11/2019  . Nephrolithiasis    Past Surgical History:  Procedure Laterality Date  . CARDIAC CATHETERIZATION    . CORONARY BALLOON ANGIOPLASTY N/A 12/02/2019   Procedure: CORONARY BALLOON ANGIOPLASTY;  Surgeon: Corky Crafts, MD;  Location: [facility] INVASIVE CV LAB;  Service: Cardiovascular;  Laterality: N/A;  . CORONARY STENT INTERVENTION N/A 12/02/2019   Procedure: CORONARY STENT INTERVENTION;  Surgeon: Corky Crafts, MD;  Location: [facility] INVASIVE  CV LAB;  Service: Cardiovascular;  Laterality: N/A;  . INTRAVASCULAR ULTRASOUND/IVUS N/A 12/02/2019   Procedure: Intravascular Ultrasound/IVUS;  Surgeon: Corky Crafts, MD;  Location: [facility] INVASIVE CV LAB;  Service: Cardiovascular;  Laterality: N/A;  . IR NEPHROSTOMY EXCHANGE LEFT  12/28/2019  . IR NEPHROSTOMY EXCHANGE LEFT  02/09/2020  . IR NEPHROSTOMY EXCHANGE LEFT  03/22/2020  . IR NEPHROSTOMY EXCHANGE LEFT  05/03/2020  . IR NEPHROSTOMY PLACEMENT LEFT  11/16/2019  . LEFT HEART CATH AND CORONARY ANGIOGRAPHY N/A 12/02/2019   Procedure: LEFT HEART CATH AND CORONARY ANGIOGRAPHY;  Surgeon: Corky Crafts, MD;  Location: [facility] INVASIVE CV LAB;  Service: Cardiovascular;  Laterality: N/A;  . LITHOTRIPSY    . NEPHROLITHOTOMY Left 06/13/2020   Procedure: NEPHROLITHOTOMY PERCUTANEOUS;  Surgeon: Sebastian Ache, MD;  Location: [facility] ORS;  Service: Urology;  Laterality: Left;  3 HRS  . TUBAL LIGATION  1984  . URETEROSCOPY Left 06/13/2020   Procedure: URETEROSCOPY WITH BASKETING AND STENT PLACEMENT;  Surgeon: Sebastian Ache, MD;  Location: [facility] ORS;  Service: Urology;  Laterality: Left;    Home Medications:  (Not in a hospital admission)  Allergies: No Known Allergies  Family History  Problem Relation Age of Onset  . Heart attack Mother   . Heart attack Father   . Heart attack Brother    Social History:  reports that she has never smoked. She has never used smokeless tobacco. She reports that she does not drink alcohol and does not use drugs.  ROS: A complete review of systems was performed.  All systems are negative except for pertinent findings as noted. ROS   Physical Exam:  Vital signs in last 24 hours: Temp:  [97.3 F (36.3 C)-98.4 F (36.9 C)] 97.3 F (36.3 C) (08/03 1516) Pulse Rate:  [66-99] 95 (08/03 1913) Resp:  [17-25] 17 (08/03 1913) BP: (114-189)/(56-79) 119/56 (08/03 1913) SpO2:  [97 %-99 %] 98 % (08/03 1913) Weight:  [62.6 kg] 62.6 kg (08/02 2232) General:  Alert and  oriented, No acute distress HEENT: Normocephalic, atraumatic Neck: No JVD or lymphadenopathy Cardiovascular: Regular rate and rhythm Lungs: Regular rate and effort Abdomen: Soft, nontender, nondistended, no abdominal masses Back: No CVA tenderness.  Nephrostomy tube site with a dressing in place with mild drainage there Extremities: No edema Neurologic: Grossly intact  Laboratory Data:  Results for orders placed or performed during the hospital encounter of 06/19/20 (from the past 24 hour(s))  Basic metabolic panel     Status: Abnormal   Collection Time: 06/19/20  4:28 PM  Result Value Ref Range   Sodium 126 (L) 135 - 145 mmol/L   Potassium 4.8 3.5 - 5.1 mmol/L   Chloride 89 (L) 98 - 111 mmol/L   CO2 20 (L) 22 - 32 mmol/L   Glucose, Bld 204 (H) 70 - 99 mg/dL   BUN 23 8 - 23 mg/dL   Creatinine, Ser 6.96 (H) 0.44 - 1.00 mg/dL   Calcium 9.4 8.9 - 29.5 mg/dL   GFR calc non Af Amer 45 (L) >60 mL/min   GFR calc Af Amer 52 (L) >60 mL/min   Anion gap 17 (H) 5 - 15  CBC with Differential     Status: Abnormal   Collection Time: 06/19/20  4:28 PM  Result Value Ref Range   WBC 21.3 (H) 4.0 - 10.5 K/uL   RBC 4.10 3.87 - 5.11 MIL/uL   Hemoglobin 11.6 (L) 12.0 - 15.0 g/dL   HCT 28.4 (L) 36 - 46 %   MCV 85.6 80.0 - 100.0 fL   MCH 28.3 26.0 - 34.0 pg   MCHC 33.0 30.0 - 36.0 g/dL   RDW 13.2 44.0 - 10.2 %   Platelets 326 150 - 400 K/uL   nRBC 0.0 0.0 - 0.2 %   Neutrophils Relative % 87 %   Neutro Abs 18.7 (H) 1.7 - 7.7 K/uL   Lymphocytes Relative 5 %   Lymphs Abs 1.0 0.7 - 4.0 K/uL   Monocytes Relative 4 %   Monocytes Absolute 0.8 0 - 1 K/uL   Eosinophils Relative 0 %   Eosinophils Absolute 0.0 0 - 0 K/uL   Basophils Relative 0 %   Basophils Absolute 0.1 0 - 0 K/uL   Immature Granulocytes 4 %   Abs Immature Granulocytes 0.77 (H) 0.00 - 0.07 K/uL  Urinalysis, Routine w reflex microscopic     Status: Abnormal   Collection Time: 06/19/20  4:28 PM  Result Value Ref Range   Color, Urine  YELLOW YELLOW   APPearance CLOUDY (A) CLEAR   Specific Gravity, Urine 1.024 1.005 - 1.030   pH 5.0 5.0 - 8.0   Glucose, UA NEGATIVE NEGATIVE mg/dL   Hgb urine dipstick LARGE (A) NEGATIVE   Bilirubin Urine NEGATIVE NEGATIVE   Ketones, ur NEGATIVE NEGATIVE mg/dL   Protein, ur 725 (A) NEGATIVE mg/dL   Nitrite NEGATIVE NEGATIVE   Leukocytes,Ua LARGE (A) NEGATIVE   RBC / HPF >50 (H) 0 - 5 RBC/hpf   WBC, UA >50 (H) 0 - 5 WBC/hpf   Bacteria, UA FEW (A) NONE SEEN   Squamous Epithelial / LPF 0-5 0 - 5  WBC Clumps PRESENT    Mucus PRESENT   Type and screen [facility]     Status: None   Collection Time: 06/19/20  4:28 PM  Result Value Ref Range   ABO/RH(D) O POS    Antibody Screen NEG    Sample Expiration      06/22/2020,2359 Performed at [facility], [address] [place], [place] [address]   ABO/Rh     Status: None   Collection Time: 06/19/20  4:28 PM  Result Value Ref Range   ABO/RH(D)      O POS Performed at [facility], [address] [place], [place] [address]   SARS Coronavirus 2 by RT PCR (hospital order, performed in [facility] hospital lab) Nasopharyngeal Nasopharyngeal Swab     Status: None   Collection Time: 06/19/20  5:17 PM   Specimen: Nasopharyngeal Swab  Result Value Ref Range   SARS Coronavirus 2 NEGATIVE NEGATIVE   Recent Results (from the past 240 hour(s))  SARS Coronavirus 2 by RT PCR (hospital order, performed in [facility] hospital lab) Nasopharyngeal Nasopharyngeal Swab     Status: None   Collection Time: 06/19/20  5:17 PM   Specimen: Nasopharyngeal Swab  Result Value Ref Range Status   SARS Coronavirus 2 NEGATIVE NEGATIVE Final    Comment: (NOTE) SARS-CoV-2 target nucleic acids are NOT DETECTED.  The SARS-CoV-2 RNA is generally detectable in upper and lower respiratory specimens during the acute phase of infection. The lowest concentration of SARS-CoV-2 viral copies this assay can detect  is 250 copies / mL. A negative result does not preclude SARS-CoV-2 infection and should not be used as the sole basis for treatment or other patient management decisions.  A negative result may occur with improper specimen collection / handling, submission of specimen other than nasopharyngeal swab, presence of viral mutation(s) within the areas targeted by this assay, and inadequate number of viral copies (<250 copies / mL). A negative result must be combined with clinical observations, patient history, and epidemiological information.  Fact Sheet for Patients:   BoilerBrush.com.cy  Fact Sheet for Healthcare Providers: https://pope.com/  This test is not yet approved or  cleared by the [place] FDA and has been authorized for detection and/or diagnosis of SARS-CoV-2 by FDA under an Emergency Use Authorization (EUA).  This EUA will remain in effect (meaning this test can be used) for the duration of the COVID-19 declaration under Section 564(b)(1) of the Act, 21 U.S.C. section 360bbb-3(b)(1), unless the authorization is terminated or revoked sooner.  Performed at [facility], [address] [place], [place] [address]    Creatinine: Recent Labs    06/14/20 0535 06/18/20 2339 06/19/20 1628  CREATININE 0.97 0.92 1.22*   CT scan personally reviewed and is detailed in history of present illness  Impression/Assessment:  Gross hematuria Left renal calculus status post PCNL Mild left hydronephrosis  Plan:  I will plan to Place Foley catheter.  I admit her overnight for observation and observe the wound.  She may be having some reflux up the stent and subsequently out her nephrostomy tube site.  If no improvement, she may need a stent exchange.  However, we will continue fluids for now with a Foley catheter for maximal decompression.  Repeat labs in the morning.  Ray Church, III 06/19/2020, 8:19 PM

## 2020-06-19 NOTE — ED Triage Notes (Signed)
Patient sent from primary care doctor for CT and blood work Patient had surgery on kidneys last week and has bleeding in urine and from the post op dressing

## 2020-06-20 LAB — ABO/RH
ABO/RH(D): O POS
ABO/RH(D): O POS

## 2020-06-20 LAB — BASIC METABOLIC PANEL
Anion gap: 12 (ref 5–15)
BUN: 25 mg/dL — ABNORMAL HIGH (ref 8–23)
CO2: 23 mmol/L (ref 22–32)
Calcium: 8.9 mg/dL (ref 8.9–10.3)
Chloride: 93 mmol/L — ABNORMAL LOW (ref 98–111)
Creatinine, Ser: 1.27 mg/dL — ABNORMAL HIGH (ref 0.44–1.00)
GFR calc Af Amer: 49 mL/min — ABNORMAL LOW (ref >60)
GFR calc non Af Amer: 42 mL/min — ABNORMAL LOW (ref >60)
Glucose, Bld: 184 mg/dL — ABNORMAL HIGH (ref 70–99)
Potassium: 4.1 mmol/L (ref 3.5–5.1)
Sodium: 128 mmol/L — ABNORMAL LOW (ref 135–145)

## 2020-06-20 LAB — GLUCOSE, CAPILLARY
Glucose-Capillary: 136 mg/dL — ABNORMAL HIGH (ref 70–99)
Glucose-Capillary: 127 mg/dL — ABNORMAL HIGH (ref 70–99)

## 2020-06-20 LAB — HEMOGLOBIN AND HEMATOCRIT, BLOOD
HCT: 29.7 % — ABNORMAL LOW (ref 36.0–46.0)
Hemoglobin: 9.9 g/dL — ABNORMAL LOW (ref 12.0–15.0)

## 2020-06-20 LAB — CBG MONITORING, ED
Glucose-Capillary: 116 mg/dL — ABNORMAL HIGH (ref 70–99)
Glucose-Capillary: 168 mg/dL — ABNORMAL HIGH (ref 70–99)
Glucose-Capillary: 123 mg/dL — ABNORMAL HIGH (ref 70–99)

## 2020-06-20 MED ORDER — SODIUM CHLORIDE 0.9 % IV SOLN
1.0000 g | INTRAVENOUS | Status: DC
  Administered 2020-06-20 – 2020-06-22 (×3): 1 g via INTRAVENOUS
  Filled 2020-06-20: qty 10
  Filled 2020-06-20 (×2): qty 1

## 2020-06-20 MED ORDER — CHLORHEXIDINE GLUCONATE CLOTH 2 % EX PADS
6.0000 | MEDICATED_PAD | Freq: Every day | CUTANEOUS | Status: DC
  Administered 2020-06-21 – 2020-06-22 (×2): 6 via TOPICAL

## 2020-06-20 NOTE — ED Notes (Signed)
Dressing on L flank changed, gown changed and chux

## 2020-06-20 NOTE — Progress Notes (Signed)
Urology Inpatient Progress Report  Hematuria [R31.9]        Intv/Subj: No acute events overnight. Patient is without complaint. She did have a low-grade temperature of 100.4. Leukocytosis yesterday close to 20. I therefore started her on ceftriaxone this morning. Put an order in for nursing to place a urostomy bag over the nephrostomy tube site. This will allow for accurate I's and O's.  Active Problems:   Hematuria  Current Facility-Administered Medications  Medication Dose Route Frequency Provider Last Rate Last Admin  . 0.9 %  sodium chloride infusion   Intravenous Continuous Ray Church III, MD 75 mL/hr at 06/20/20 0531 Rate Verify at 06/20/20 0531  . acetaminophen (TYLENOL) tablet 650 mg  650 mg Oral Q4H PRN Ray Church III, MD      . aspirin EC tablet 81 mg  81 mg Oral Daily Ray Church III, MD   81 mg at 06/20/20 0936  . atorvastatin (LIPITOR) tablet 40 mg  40 mg Oral QPM Ray Church III, MD   40 mg at 06/19/20 2142  . cefTRIAXone (ROCEPHIN) 1 g in sodium chloride 0.9 % 100 mL IVPB  1 g Intravenous Q24H Ray Church III, MD 200 mL/hr at 06/20/20 1006 1 g at 06/20/20 1006  . diphenhydrAMINE (BENADRYL) injection 12.5 mg  12.5 mg Intravenous Q6H PRN Ray Church III, MD       Or  . diphenhydrAMINE (BENADRYL) 12.5 MG/5ML elixir 12.5 mg  12.5 mg Oral Q6H PRN Ray Church III, MD      . docusate sodium (COLACE) capsule 100 mg  100 mg Oral BID Ray Church III, MD   100 mg at 06/20/20 0936  . enalapril (VASOTEC) tablet 20 mg  20 mg Oral Daily Ray Church III, MD   20 mg at 06/20/20 5956  . ferrous sulfate tablet 325 mg  325 mg Oral Daily Ray Church III, MD   325 mg at 06/20/20 0936  . fluticasone (FLONASE) 50 MCG/ACT nasal spray 1-2 spray  1-2 spray Each Nare Daily PRN Ray Church III, MD      . insulin aspart (novoLOG) injection 0-15 Units  0-15 Units Subcutaneous TID WC Ray Church III, MD   3 Units at 06/20/20 [phone]  . metoprolol tartrate (LOPRESSOR)  tablet 12.5 mg  12.5 mg Oral BID Ray Church III, MD   12.5 mg at 06/20/20 0936  . morphine 2 MG/ML injection 2-4 mg  2-4 mg Intravenous Q2H PRN Ray Church III, MD      . nitroGLYCERIN (NITROSTAT) SL tablet 0.4 mg  0.4 mg Sublingual Q5 min PRN Ray Church III, MD      . ondansetron [facility]) injection 4 mg  4 mg Intravenous Q4H PRN Ray Church III, MD      . opium-belladonna (B&O) suppository 16.2-60mg   1 suppository Rectal Q6H PRN Ray Church III, MD      . oxybutynin (DITROPAN) tablet 5 mg  5 mg Oral Q8H PRN Ray Church III, MD      . oxyCODONE (Oxy IR/ROXICODONE) immediate release tablet 5 mg  5 mg Oral Q4H PRN Ray Church III, MD      . polyethylene glycol (MIRALAX / GLYCOLAX) packet 17 g  17 g Oral Daily PRN Ray Church III, MD      . senna (SENOKOT) tablet 8.6 mg  1 tablet Oral BID Crista Elliot, MD  8.6 mg at 06/20/20 0936  . sertraline (ZOLOFT) tablet 50 mg  50 mg Oral Daily Ray Church III, MD   50 mg at 06/20/20 0936  . vitamin B-12 (CYANOCOBALAMIN) tablet 1,000 mcg  1,000 mcg Oral Daily Ray Church III, MD   1,000 mcg at 06/20/20 0936  . zolpidem (AMBIEN) tablet 5 mg  5 mg Oral QHS PRN Crista Elliot, MD       Current Outpatient Medications  Medication Sig Dispense Refill  . acetaminophen (TYLENOL) 325 MG tablet Take 2 tablets (650 mg total) by mouth every 6 (six) hours as needed for mild pain (or Fever >/= 101). 30 tablet 0  . alendronate (FOSAMAX) 70 MG tablet Take 70 mg by mouth once a week. Take with a full glass of water on an empty stomach.     Marland Kitchen aspirin EC 81 MG EC tablet Take 1 tablet (81 mg total) by mouth daily. 30 tablet 0  . atorvastatin (LIPITOR) 40 MG tablet Take 1 tablet (40 mg total) by mouth daily. (Patient taking differently: Take 40 mg by mouth every evening. ) 30 tablet 0  . Cholecalciferol (VITAMIN D-3) 125 MCG (5000 UT) TABS Take 5,000 Units by mouth daily.    . enalapril (VASOTEC) 20 MG tablet Take 20 mg by mouth daily.     . ferrous sulfate 325 (65 FE) MG tablet Take 325 mg by mouth daily.    . fluticasone (FLONASE) 50 MCG/ACT nasal spray Place 1-2 sprays into both nostrils daily as needed for allergies or rhinitis.    . metFORMIN (GLUCOPHAGE) 1000 MG tablet Take 1,000 mg by mouth 2 (two) times daily with a meal.    . metoprolol tartrate (LOPRESSOR) 25 MG tablet TAKE 1/2 TABLET BY MOUTH TWICE DAILY (Patient taking differently: Take 12.5 mg by mouth 2 (two) times daily. ) 30 tablet 3  . polyethylene glycol (MIRALAX / GLYCOLAX) 17 g packet Take 17 g by mouth daily as needed for mild constipation. 14 each 0  . sertraline (ZOLOFT) 50 MG tablet Take 50 mg by mouth daily.    . sitaGLIPtin (JANUVIA) 25 MG tablet Take 25 mg by mouth at bedtime.     . traMADol (ULTRAM) 50 MG tablet Take 1 tablet (50 mg total) by mouth every 6 (six) hours as needed for moderate pain or severe pain. Post-operatively 15 tablet 0  . vitamin B-12 (CYANOCOBALAMIN) 1000 MCG tablet Take 1,000 mcg by mouth daily.    . nitroGLYCERIN (NITROSTAT) 0.4 MG SL tablet Place 1 tablet (0.4 mg total) under the tongue every 5 (five) minutes as needed for chest pain (up to 3 doses. If taking 3rd dose call 911). 25 tablet 3     Objective: Vital: Vitals:   06/20/20 0600 06/20/20 0806 06/20/20 0902 06/20/20 1110  BP: (!) 118/58 (!) 98/48 (!) 118/55 110/63  Pulse: 95 85 83 94  Resp: (!) 22 20 (!) 23 (!) 23  Temp:      SpO2: 93% 96% 98% 96%   I/Os: I/O last 3 completed shifts: In: 586 [I.V.:586] Out: 825 [Urine:825]  Physical Exam:  General: Patient is in no apparent distress Lungs: Normal respiratory effort, chest expands symmetrically. GI: The abdomen is soft and nontender without mass. Foley: Draining light red urine Ext: lower extremities symmetric  Lab Results: Recent Labs    06/18/20 2339 06/19/20 1628 06/20/20 0500  WBC 17.3* 21.3*  --   HGB 11.7* 11.6* 9.9*  HCT 35.0* 35.1* 29.7*  Recent Labs    06/18/20 2339 06/19/20 1628  06/20/20 0500  NA 128* 126* 128*  K 4.6 4.8 4.1  CL 94* 89* 93*  CO2 22 20* 23  GLUCOSE 234* 204* 184*  BUN 19 23 25*  CREATININE 0.92 1.22* 1.27*  CALCIUM 9.7 9.4 8.9   No results for input(s): LABPT, INR in the last 72 hours. No results for input(s): LABURIN in the last 72 hours. Results for orders placed or performed during the hospital encounter of 06/19/20  SARS Coronavirus 2 by RT PCR (hospital order, performed in [facility] hospital lab) Nasopharyngeal Nasopharyngeal Swab     Status: None   Collection Time: 06/19/20  5:17 PM   Specimen: Nasopharyngeal Swab  Result Value Ref Range Status   SARS Coronavirus 2 NEGATIVE NEGATIVE Final    Comment: (NOTE) SARS-CoV-2 target nucleic acids are NOT DETECTED.  The SARS-CoV-2 RNA is generally detectable in upper and lower respiratory specimens during the acute phase of infection. The lowest concentration of SARS-CoV-2 viral copies this assay can detect is 250 copies / mL. A negative result does not preclude SARS-CoV-2 infection and should not be used as the sole basis for treatment or other patient management decisions.  A negative result may occur with improper specimen collection / handling, submission of specimen other than nasopharyngeal swab, presence of viral mutation(s) within the areas targeted by this assay, and inadequate number of viral copies (<250 copies / mL). A negative result must be combined with clinical observations, patient history, and epidemiological information.  Fact Sheet for Patients:   BoilerBrush.com.cy  Fact Sheet for Healthcare Providers: https://pope.com/  This test is not yet approved or  cleared by the [place] FDA and has been authorized for detection and/or diagnosis of SARS-CoV-2 by FDA under an Emergency Use Authorization (EUA).  This EUA will remain in effect (meaning this test can be used) for the duration of the COVID-19 declaration  under Section 564(b)(1) of the Act, 21 U.S.C. section 360bbb-3(b)(1), unless the authorization is terminated or revoked sooner.  Performed at [facility], [address] [place], [place] [address]     Studies/Results: CT Angio Abd/Pel W and/or Wo Contrast  Result Date: 06/19/2020 CLINICAL DATA:  74 year old female with left-sided percutaneous nephrolithotomy and stent placement. Hematuria. EXAM: CTA ABDOMEN AND PELVIS WITHOUT AND WITH CONTRAST TECHNIQUE: Multidetector CT imaging of the abdomen and pelvis was performed using the standard protocol during bolus administration of intravenous contrast. Multiplanar reconstructed images and MIPs were obtained and reviewed to evaluate the vascular anatomy. CONTRAST:  10mL OMNIPAQUE IOHEXOL 350 MG/ML SOLN COMPARISON:  CT abdomen pelvis dated 11/14/2019. FINDINGS: VASCULAR Aorta: Moderate atherosclerotic calcification of the abdominal aorta. No aneurysmal dilatation or dissection. No periaortic stranding or fluid. Celiac: The celiac axis is patent. SMA: The SMA is patent. There is a replaced hepatic artery from the SMA. Renals: Atherosclerotic calcification of the origins of the renal arteries. The renal arteries are patent. IMA: The IMA is patent. Inflow: Atherosclerotic calcification of the iliac arteries. The iliac arteries are patent. No aneurysmal dilatation or dissection. Proximal Outflow: Bilateral common femoral and visualized portions of the superficial and profunda femoral arteries are patent without evidence of aneurysm, dissection, vasculitis or significant stenosis. Veins: The SMV, splenic vein, and main portal vein are patent. No portal venous gas. The IVC is unremarkable. Review of the MIP images confirms the above findings. NON-VASCULAR Lower chest: The visualized lung bases are clear. Advanced coronary vascular calcification involving the LAD. No  intra-abdominal free air. Small free fluid in the pelvis. Hepatobiliary: The liver  is unremarkable. No calcified gallstone or pericholecystic fluid. No biliary ductal dilatation. Pancreas: Unremarkable. No pancreatic ductal dilatation or surrounding inflammatory changes. Spleen: Normal in size without focal abnormality. Adrenals/Urinary Tract: Indeterminate 2 cm left adrenal nodule. The right adrenal glands are unremarkable. There is a left ureteral stent with proximal tip in the left renal pelvis and distal and within the urinary bladder. Small calcific foci within the left renal collecting system, likely residual stone fragments. No stone identified along the course of the left ureter. There is mild left hydronephrosis similar or slightly decreased since the study of 11/14/2019. There is high attenuating content within the left renal collecting system on precontrast images consistent with blood product or proteinaceous content. No significant change in the attenuation of the contents of the collecting system on post-contrast images. No evidence of active bleed. Focal area of parenchymal defect along the posterior aspect of the lower pole secondary to recent procedure. There is mild left perinephric stranding, likely related to recent procedure. Correlation with urinalysis recommended to exclude UTI. No fluid collection or abscess. Subcentimeter right renal hypodense focus is too small to characterize. There is no hydronephrosis or nephrolithiasis on the right. The right ureter is unremarkable. The urinary bladder appears unremarkable as well. Stomach/Bowel: There is sigmoid diverticulosis without active inflammatory changes. There is no bowel obstruction or active inflammation. The appendix is not visualized with certainty. No inflammatory changes identified in the right lower quadrant. Lymphatic: No adenopathy. Reproductive: The uterus is anteverted and grossly unremarkable. No adnexal masses. Other: Small amount of fluid along the left paracolic gutter extending to the pelvis, likely related  to recent procedure. No large collection. Musculoskeletal: Degenerative changes of the spine. No acute osseous pathology. IMPRESSION: 1. Status post percutaneous nephrolithotomy and placement of a left ureteral stent. No evidence of active bleed. 2. Mild left hydronephrosis with high attenuating content within the left renal collecting system, likely hemorrhagic or proteinaceous content. Several small residual stones remain in the left renal collecting system. 3. Sigmoid diverticulosis. No bowel obstruction. 4. Aortic Atherosclerosis (ICD10-I70.0). Electronically Signed   By: Elgie Collard M.D.   On: 06/19/2020 19:16    Assessment: Left renal calculi status post left PCNL Gross hematuria  Plan: She continues to have significant leakage from her nephrostomy site. This is despite Foley catheter decompression. I will start her on ceftriaxone and see how she does overnight as I continue Foley catheter. Recheck labs in the morning. Possible stent exchange versus observation. Monitor for signs of infection.   Modena Slater, MD Urology 06/20/2020, 1:14 PM

## 2020-06-20 NOTE — ED Notes (Signed)
Dressing on back changed, gown changed, pt repositioned, given water.

## 2020-06-21 DIAGNOSIS — Z87442 Personal history of urinary calculi: Secondary | ICD-10-CM | POA: Diagnosis not present

## 2020-06-21 DIAGNOSIS — Z955 Presence of coronary angioplasty implant and graft: Secondary | ICD-10-CM | POA: Diagnosis not present

## 2020-06-21 DIAGNOSIS — Z7984 Long term (current) use of oral hypoglycemic drugs: Secondary | ICD-10-CM | POA: Diagnosis not present

## 2020-06-21 DIAGNOSIS — Z7982 Long term (current) use of aspirin: Secondary | ICD-10-CM | POA: Diagnosis not present

## 2020-06-21 DIAGNOSIS — N99820 Postprocedural hemorrhage and hematoma of a genitourinary system organ or structure following a genitourinary system procedure: Secondary | ICD-10-CM | POA: Diagnosis present

## 2020-06-21 DIAGNOSIS — I252 Old myocardial infarction: Secondary | ICD-10-CM | POA: Diagnosis not present

## 2020-06-21 DIAGNOSIS — I2510 Atherosclerotic heart disease of native coronary artery without angina pectoris: Secondary | ICD-10-CM | POA: Diagnosis present

## 2020-06-21 DIAGNOSIS — E119 Type 2 diabetes mellitus without complications: Secondary | ICD-10-CM | POA: Diagnosis present

## 2020-06-21 DIAGNOSIS — I10 Essential (primary) hypertension: Secondary | ICD-10-CM | POA: Diagnosis present

## 2020-06-21 DIAGNOSIS — N1330 Unspecified hydronephrosis: Secondary | ICD-10-CM | POA: Diagnosis present

## 2020-06-21 DIAGNOSIS — Y838 Other surgical procedures as the cause of abnormal reaction of the patient, or of later complication, without mention of misadventure at the time of the procedure: Secondary | ICD-10-CM | POA: Diagnosis present

## 2020-06-21 DIAGNOSIS — Z8249 Family history of ischemic heart disease and other diseases of the circulatory system: Secondary | ICD-10-CM | POA: Diagnosis not present

## 2020-06-21 DIAGNOSIS — Z20822 Contact with and (suspected) exposure to covid-19: Secondary | ICD-10-CM | POA: Diagnosis present

## 2020-06-21 DIAGNOSIS — G8918 Other acute postprocedural pain: Secondary | ICD-10-CM | POA: Diagnosis present

## 2020-06-21 DIAGNOSIS — Z7983 Long term (current) use of bisphosphonates: Secondary | ICD-10-CM | POA: Diagnosis not present

## 2020-06-21 DIAGNOSIS — Z79899 Other long term (current) drug therapy: Secondary | ICD-10-CM | POA: Diagnosis not present

## 2020-06-21 DIAGNOSIS — E785 Hyperlipidemia, unspecified: Secondary | ICD-10-CM | POA: Diagnosis present

## 2020-06-21 DIAGNOSIS — Z9851 Tubal ligation status: Secondary | ICD-10-CM | POA: Diagnosis not present

## 2020-06-21 LAB — BASIC METABOLIC PANEL
Anion gap: 9 (ref 5–15)
BUN: 24 mg/dL — ABNORMAL HIGH (ref 8–23)
CO2: 22 mmol/L (ref 22–32)
Calcium: 9 mg/dL (ref 8.9–10.3)
Chloride: 98 mmol/L (ref 98–111)
Creatinine, Ser: 1.08 mg/dL — ABNORMAL HIGH (ref 0.44–1.00)
GFR calc Af Amer: 60 mL/min — ABNORMAL LOW (ref >60)
GFR calc non Af Amer: 52 mL/min — ABNORMAL LOW (ref >60)
Glucose, Bld: 152 mg/dL — ABNORMAL HIGH (ref 70–99)
Potassium: 3.2 mmol/L — ABNORMAL LOW (ref 3.5–5.1)
Sodium: 129 mmol/L — ABNORMAL LOW (ref 135–145)

## 2020-06-21 LAB — GLUCOSE, CAPILLARY
Glucose-Capillary: 128 mg/dL — ABNORMAL HIGH (ref 70–99)
Glucose-Capillary: 115 mg/dL — ABNORMAL HIGH (ref 70–99)
Glucose-Capillary: 119 mg/dL — ABNORMAL HIGH (ref 70–99)
Glucose-Capillary: 155 mg/dL — ABNORMAL HIGH (ref 70–99)

## 2020-06-21 LAB — CBC
HCT: 28.4 % — ABNORMAL LOW (ref 36.0–46.0)
Hemoglobin: 9.3 g/dL — ABNORMAL LOW (ref 12.0–15.0)
MCH: 28.3 pg (ref 26.0–34.0)
MCHC: 32.7 g/dL (ref 30.0–36.0)
MCV: 86.3 fL (ref 80.0–100.0)
Platelets: 258 10*3/uL (ref 150–400)
RBC: 3.29 MIL/uL — ABNORMAL LOW (ref 3.87–5.11)
RDW: 13.3 % (ref 11.5–15.5)
WBC: 11.7 10*3/uL — ABNORMAL HIGH (ref 4.0–10.5)
nRBC: 0 % (ref 0.0–0.2)

## 2020-06-21 LAB — URINE CULTURE

## 2020-06-22 LAB — GLUCOSE, CAPILLARY
Glucose-Capillary: 121 mg/dL — ABNORMAL HIGH (ref 70–99)
Glucose-Capillary: 220 mg/dL — ABNORMAL HIGH (ref 70–99)

## 2020-06-22 MED ORDER — CEPHALEXIN 500 MG PO CAPS: 500.0000 mg | ORAL_CAPSULE | Freq: Four times a day (QID) | ORAL | 0 refills | Status: AC

## 2020-06-22 NOTE — Progress Notes (Signed)
Foley catheter clamped.

## 2020-06-22 NOTE — Progress Notes (Signed)
Nutrition Brief Note  Patient identified on the Malnutrition Screening Tool (MST) Report  Pt consuming 75-100% of meals at this time.  Wt Readings from Last 15 Encounters:  06/20/20 62.6 kg  06/13/20 62.8 kg  06/04/20 62.8 kg  05/01/20 63 kg  02/07/20 64.9 kg  12/09/19 66.2 kg  12/02/19 67.6 kg  11/28/19 67.6 kg  11/19/19 69.9 kg    Body mass index is 24.44 kg/m. Patient meets criteria for normal based on current BMI.   Current diet order is CHO modified, patient is consuming approximately 75-100% of meals at this time. Labs and medications reviewed.   No nutrition interventions warranted at this time. If nutrition issues arise, please consult RD.   Tilda Franco, MS, RD, LDN Inpatient Clinical Dietitian Contact information available via Amion

## 2020-06-22 NOTE — Discharge Instructions (Signed)
It is okay to have some drainage from the back.  Anticipated will continue to improve.

## 2020-06-22 NOTE — Discharge Summary (Signed)
Physician Discharge Summary  Patient ID: Claudia Christian MRN: [id number] DOB/AGE: 07/22/1946 74 y.o.  Admit date: 06/19/2020 Discharge date: 06/22/2020  Admission Diagnoses:  Discharge Diagnoses:  Active Problems:   Hematuria   Discharged Condition: good  Hospital Course: Patient was admitted with gross hematuria and a large amount of drainage from her nephrostomy tract after undergoing a left PCNL on 06/13/2020.  A Foley catheter was placed.  Her nephrostomy site output decreased over the next couple days.  She did have a low-grade fever that improved after ceftriaxone.  By the day of discharge, her urine was clear, output had decreased from nephrostomy site.  The Foley catheter was clamped and she continued to have low output from the nephrostomy site.  She was discharged home in stable condition  Consults: None  Significant Diagnostic Studies: CTA revealed no evidence of acute bleed or urinoma  Treatments: IV hydration  Discharge Exam: Blood pressure 125/69, pulse 62, temperature 98.3 F (36.8 C), resp. rate 15, height 5\' 3"  (1.6 m), weight 62.6 kg, SpO2 98 %. General appearance: alert no acute distress Abdomen soft, nontender, nondistended Foley catheter draining clear, voided after removal Nephrostomy site without evidence of infection.  Low output.  Disposition: Discharge disposition: 01-Home or Self Care       Discharge Instructions    No wound care   Complete by: As directed      Allergies as of 06/22/2020   No Known Allergies     Medication List    TAKE these medications   acetaminophen 325 MG tablet Commonly known as: TYLENOL Take 2 tablets (650 mg total) by mouth every 6 (six) hours as needed for mild pain (or Fever >/= 101).   alendronate 70 MG tablet Commonly known as: FOSAMAX Take 70 mg by mouth once a week. Take with a full glass of water on an empty stomach.   aspirin 81 MG EC tablet Take 1 tablet (81 mg total) by mouth daily.   atorvastatin 40  MG tablet Commonly known as: Lipitor Take 1 tablet (40 mg total) by mouth daily. What changed: when to take this   cephALEXin 500 MG capsule Commonly known as: KEFLEX Take 1 capsule (500 mg total) by mouth 4 (four) times daily for 7 days.   enalapril 20 MG tablet Commonly known as: VASOTEC Take 20 mg by mouth daily.   ferrous sulfate 325 (65 FE) MG tablet Take 325 mg by mouth daily.   fluticasone 50 MCG/ACT nasal spray Commonly known as: FLONASE Place 1-2 sprays into both nostrils daily as needed for allergies or rhinitis.   metFORMIN 1000 MG tablet Commonly known as: GLUCOPHAGE Take 1,000 mg by mouth 2 (two) times daily with a meal.   metoprolol tartrate 25 MG tablet Commonly known as: LOPRESSOR TAKE 1/2 TABLET BY MOUTH TWICE DAILY   nitroGLYCERIN 0.4 MG SL tablet Commonly known as: Nitrostat Place 1 tablet (0.4 mg total) under the tongue every 5 (five) minutes as needed for chest pain (up to 3 doses. If taking 3rd dose call 911).   polyethylene glycol 17 g packet Commonly known as: MIRALAX / GLYCOLAX Take 17 g by mouth daily as needed for mild constipation.   sertraline 50 MG tablet Commonly known as: ZOLOFT Take 50 mg by mouth daily.   sitaGLIPtin 25 MG tablet Commonly known as: JANUVIA Take 25 mg by mouth at bedtime.   traMADol 50 MG tablet Commonly known as: Ultram Take 1 tablet (50 mg total) by mouth every 6 (six) hours  as needed for moderate pain or severe pain. Post-operatively   vitamin B-12 1000 MCG tablet Commonly known as: CYANOCOBALAMIN Take 1,000 mcg by mouth daily.   Vitamin D-3 125 MCG (5000 UT) Tabs Take 5,000 Units by mouth daily.        Signed: Ray Church, III 06/22/2020, 4:22 PM

## 2020-06-26 NOTE — Progress Notes (Signed)
Urology Inpatient Progress Report  Post-operative pain [G89.18] Hematuria [R31.9] Hematuria, unspecified type [R31.9]        Intv/Subj: No acute events overnight. Patient is without complaint. Hemoglobin is 9.3 from 9.9.  Yesterday, she had dark red wine colored urine.  However, today, her urine is clearing up.   Active Problems:   Hematuria  No current facility-administered medications for this encounter.   Current Outpatient Medications  Medication Sig Dispense Refill  . acetaminophen (TYLENOL) 325 MG tablet Take 2 tablets (650 mg total) by mouth every 6 (six) hours as needed for mild pain (or Fever >/= 101). 30 tablet 0  . alendronate (FOSAMAX) 70 MG tablet Take 70 mg by mouth once a week. Take with a full glass of water on an empty stomach.     Marland Kitchen aspirin EC 81 MG EC tablet Take 1 tablet (81 mg total) by mouth daily. 30 tablet 0  . atorvastatin (LIPITOR) 40 MG tablet Take 1 tablet (40 mg total) by mouth daily. (Patient taking differently: Take 40 mg by mouth every evening. ) 30 tablet 0  . Cholecalciferol (VITAMIN D-3) 125 MCG (5000 UT) TABS Take 5,000 Units by mouth daily.    . enalapril (VASOTEC) 20 MG tablet Take 20 mg by mouth daily.    . ferrous sulfate 325 (65 FE) MG tablet Take 325 mg by mouth daily.    . fluticasone (FLONASE) 50 MCG/ACT nasal spray Place 1-2 sprays into both nostrils daily as needed for allergies or rhinitis.    . metFORMIN (GLUCOPHAGE) 1000 MG tablet Take 1,000 mg by mouth 2 (two) times daily with a meal.    . metoprolol tartrate (LOPRESSOR) 25 MG tablet TAKE 1/2 TABLET BY MOUTH TWICE DAILY (Patient taking differently: Take 12.5 mg by mouth 2 (two) times daily. ) 30 tablet 3  . polyethylene glycol (MIRALAX / GLYCOLAX) 17 g packet Take 17 g by mouth daily as needed for mild constipation. 14 each 0  . sertraline (ZOLOFT) 50 MG tablet Take 50 mg by mouth daily.    . sitaGLIPtin (JANUVIA) 25 MG tablet Take 25 mg by mouth at bedtime.     . traMADol (ULTRAM)  50 MG tablet Take 1 tablet (50 mg total) by mouth every 6 (six) hours as needed for moderate pain or severe pain. Post-operatively 15 tablet 0  . vitamin B-12 (CYANOCOBALAMIN) 1000 MCG tablet Take 1,000 mcg by mouth daily.    . cephALEXin (KEFLEX) 500 MG capsule Take 1 capsule (500 mg total) by mouth 4 (four) times daily for 7 days. 28 capsule 0  . nitroGLYCERIN (NITROSTAT) 0.4 MG SL tablet Place 1 tablet (0.4 mg total) under the tongue every 5 (five) minutes as needed for chest pain (up to 3 doses. If taking 3rd dose call 911). 25 tablet 3     Objective: Vital: Vitals:   06/21/20 0219 06/21/20 1512 06/21/20 1957 06/22/20 0343  BP: 119/66 113/63 111/65 125/69  Pulse: 75 76 73 62  Resp: 15 18 18 15   Temp: 99.2 F (37.3 C) 97.8 F (36.6 C) 98.4 F (36.9 C) 98.3 F (36.8 C)  TempSrc: Oral  Oral   SpO2: 94% 98% 98% 98%  Weight:      Height:       I/Os: No intake/output data recorded.  Physical Exam:  General: Patient is in no apparent distress Lungs: Normal respiratory effort, chest expands symmetrically. GI: The abdomen is soft and nontender without mass.   Foley:  Bright red urine  small  amount of drainage from the nephrostomy site Ext: lower extremities symmetric  Lab Results: No results for input(s): WBC, HGB, HCT in the last 72 hours. No results for input(s): NA, K, CL, CO2, GLUCOSE, BUN, CREATININE, CALCIUM in the last 72 hours. No results for input(s): LABPT, INR in the last 72 hours. No results for input(s): LABURIN in the last 72 hours. Results for orders placed or performed during the hospital encounter of 06/19/20  Culture, Urine     Status: Abnormal   Collection Time: 06/19/20  4:28 PM   Specimen: Urine, Catheterized  Result Value Ref Range Status   Specimen Description   Final    URINE, CATHETERIZED Performed at [facility], [address] [place], [place] [address]    Special Requests   Final    NONE Performed at [facility], [address] [place], [place] [address]    Culture MULTIPLE SPECIES PRESENT, SUGGEST RECOLLECTION (A)  Final   Report Status 06/21/2020 FINAL  Final  SARS Coronavirus 2 by RT PCR (hospital order, performed in [facility] hospital lab) Nasopharyngeal Nasopharyngeal Swab     Status: None   Collection Time: 06/19/20  5:17 PM   Specimen: Nasopharyngeal Swab  Result Value Ref Range Status   SARS Coronavirus 2 NEGATIVE NEGATIVE Final    Comment: (NOTE) SARS-CoV-2 target nucleic acids are NOT DETECTED.  The SARS-CoV-2 RNA is generally detectable in upper and lower respiratory specimens during the acute phase of infection. The lowest concentration of SARS-CoV-2 viral copies this assay can detect is 250 copies / mL. A negative result does not preclude SARS-CoV-2 infection and should not be used as the sole basis for treatment or other patient management decisions.  A negative result may occur with improper specimen collection / handling, submission of specimen other than nasopharyngeal swab, presence of viral mutation(s) within the areas targeted by this assay, and inadequate number of viral copies (<250 copies / mL). A negative result must be combined with clinical observations, patient history, and epidemiological information.  Fact Sheet for Patients:   BoilerBrush.com.cy  Fact Sheet for Healthcare Providers: https://pope.com/  This test is not yet approved or  cleared by the [place] FDA and has been authorized for detection and/or diagnosis of SARS-CoV-2 by FDA under an Emergency Use Authorization (EUA).  This EUA will remain in effect (meaning this test can be used) for the duration of the COVID-19 declaration under Section 564(b)(1) of the Act, 21 U.S.C. section 360bbb-3(b)(1), unless the authorization is terminated or revoked sooner.  Performed at [facility], [address] [place],  [place] [address]     Studies/Results: No results found.  Assessment:  gross hematuria  acute blood loss anemia  Plan:  continue ceftriaxone for now given her low-grade temperatures.  I will likely convert her to Keflex if she does well.  Continue Foley catheter for another day.  She may eat today.  She seems to be improving but want to observe 1 more day to ensure she does well.  If there is minimal drainage from the nephrostomy site, we will clamp the catheter tomorrow and if there is no increase in drainage we will pull the catheter.   Modena Slater, MD Urology 06/26/2020, 8:21 AM

## 2020-07-02 DIAGNOSIS — N202 Calculus of kidney with calculus of ureter: Secondary | ICD-10-CM | POA: Diagnosis not present

## 2020-07-02 DIAGNOSIS — R8271 Bacteriuria: Secondary | ICD-10-CM | POA: Diagnosis not present

## 2020-07-03 DIAGNOSIS — T8383XA Hemorrhage of genitourinary prosthetic devices, implants and grafts, initial encounter: Secondary | ICD-10-CM | POA: Diagnosis not present

## 2020-07-03 DIAGNOSIS — Z7689 Persons encountering health services in other specified circumstances: Secondary | ICD-10-CM | POA: Diagnosis not present

## 2020-07-03 DIAGNOSIS — D72829 Elevated white blood cell count, unspecified: Secondary | ICD-10-CM | POA: Diagnosis not present

## 2020-07-03 DIAGNOSIS — E1165 Type 2 diabetes mellitus with hyperglycemia: Secondary | ICD-10-CM | POA: Diagnosis not present

## 2020-07-03 DIAGNOSIS — Z6823 Body mass index (BMI) 23.0-23.9, adult: Secondary | ICD-10-CM | POA: Diagnosis not present

## 2020-07-05 DIAGNOSIS — D510 Vitamin B12 deficiency anemia due to intrinsic factor deficiency: Secondary | ICD-10-CM | POA: Diagnosis not present

## 2020-07-17 DIAGNOSIS — Z0000 Encounter for general adult medical examination without abnormal findings: Secondary | ICD-10-CM | POA: Diagnosis not present

## 2020-07-23 DIAGNOSIS — N200 Calculus of kidney: Secondary | ICD-10-CM | POA: Diagnosis not present

## 2020-07-24 DIAGNOSIS — N200 Calculus of kidney: Secondary | ICD-10-CM | POA: Diagnosis not present

## 2020-07-25 DIAGNOSIS — Z1231 Encounter for screening mammogram for malignant neoplasm of breast: Secondary | ICD-10-CM | POA: Diagnosis not present

## 2020-08-08 DIAGNOSIS — E782 Mixed hyperlipidemia: Secondary | ICD-10-CM | POA: Diagnosis not present

## 2020-08-08 DIAGNOSIS — D510 Vitamin B12 deficiency anemia due to intrinsic factor deficiency: Secondary | ICD-10-CM | POA: Diagnosis not present

## 2020-08-08 DIAGNOSIS — Z79899 Other long term (current) drug therapy: Secondary | ICD-10-CM | POA: Diagnosis not present

## 2020-08-08 DIAGNOSIS — E1121 Type 2 diabetes mellitus with diabetic nephropathy: Secondary | ICD-10-CM | POA: Diagnosis not present

## 2020-08-08 DIAGNOSIS — Z6823 Body mass index (BMI) 23.0-23.9, adult: Secondary | ICD-10-CM | POA: Diagnosis not present

## 2020-08-08 DIAGNOSIS — Z0000 Encounter for general adult medical examination without abnormal findings: Secondary | ICD-10-CM | POA: Diagnosis not present

## 2020-08-08 DIAGNOSIS — F324 Major depressive disorder, single episode, in partial remission: Secondary | ICD-10-CM | POA: Diagnosis not present

## 2020-08-16 ENCOUNTER — Other Ambulatory Visit: Payer: Self-pay | Admitting: Cardiology

## 2020-08-20 DIAGNOSIS — N202 Calculus of kidney with calculus of ureter: Secondary | ICD-10-CM | POA: Diagnosis not present

## 2020-09-06 DIAGNOSIS — E11311 Type 2 diabetes mellitus with unspecified diabetic retinopathy with macular edema: Secondary | ICD-10-CM | POA: Diagnosis not present

## 2020-11-01 DIAGNOSIS — Z87442 Personal history of urinary calculi: Secondary | ICD-10-CM | POA: Insufficient documentation

## 2020-11-01 DIAGNOSIS — I2510 Atherosclerotic heart disease of native coronary artery without angina pectoris: Secondary | ICD-10-CM | POA: Insufficient documentation

## 2020-11-01 DIAGNOSIS — E785 Hyperlipidemia, unspecified: Secondary | ICD-10-CM | POA: Insufficient documentation

## 2020-11-02 ENCOUNTER — Other Ambulatory Visit: Payer: Self-pay

## 2020-11-02 ENCOUNTER — Encounter: Payer: Self-pay | Admitting: Cardiology

## 2020-11-02 ENCOUNTER — Ambulatory Visit: Payer: Medicare PPO | Admitting: Cardiology

## 2020-11-02 VITALS — BP 138/68 | HR 70 | Ht 64.0 in | Wt 143.2 lb

## 2020-11-02 DIAGNOSIS — I10 Essential (primary) hypertension: Secondary | ICD-10-CM | POA: Diagnosis not present

## 2020-11-02 DIAGNOSIS — E782 Mixed hyperlipidemia: Secondary | ICD-10-CM

## 2020-11-02 DIAGNOSIS — I2510 Atherosclerotic heart disease of native coronary artery without angina pectoris: Secondary | ICD-10-CM

## 2020-11-02 DIAGNOSIS — E785 Hyperlipidemia, unspecified: Secondary | ICD-10-CM | POA: Diagnosis not present

## 2020-11-02 DIAGNOSIS — E1169 Type 2 diabetes mellitus with other specified complication: Secondary | ICD-10-CM

## 2020-11-02 NOTE — Patient Instructions (Signed)

## 2020-11-02 NOTE — Progress Notes (Signed)
Cardiology Office Note:    Date:  11/02/2020   ID:  Smt Lokey, DOB May 29, 1946, MRN [id number]  PCP:  Buckner Malta, MD  Cardiologist:  Garwin Brothers, MD   Referring MD: Buckner Malta, MD    ASSESSMENT:    1. Coronary artery disease involving native coronary artery of native heart without angina pectoris   2. Type 2 diabetes mellitus with hyperlipidemia (HCC)   3. Essential hypertension   4. Mixed hyperlipidemia    PLAN:    In order of problems listed above:  1. Coronary artery disease: Secondary prevention stressed with the patient.  Importance of compliance with diet medication stressed and she vocalized understanding.  She was advised to walk at least half an hour a day 5 days a week and she promises to do so. 2. Mixed dyslipidemia: Lipids were reviewed from the [facility] sheet and they are fine and I congratulated her about this. 3. Diabetes mellitus: Elevated hemoglobin A1c levels at 6.4.  Diet was emphasized exercise stressed and she promises to do better with this.  This is followed by primary care physician. 4. Essential hypertension: Blood pressure stable and lifestyle modification was revisited. 5. He is on dual antiplatelet agents and after she finishes this prescription she will discontinue Plavix as she has been about a year post intervention on January 15.  She understands this. 6. Patient will be seen in follow-up appointment in 6 months or earlier if the patient has any concerns    Medication Adjustments/Labs and Tests Ordered: Current medicines are reviewed at length with the patient today.  Concerns regarding medicines are outlined above.  No orders of the defined types were placed in this encounter.  No orders of the defined types were placed in this encounter.    No chief complaint on file.    History of Present Illness:    Claudia Christian is a 74 y.o. female.  Patient has past medical history of coronary artery disease, essential  hypertension, dyslipidemia and diabetes mellitus.  She denies any problems at this time and takes care of activities of daily living.  No chest pain orthopnea or PND.  At the time of my evaluation, the patient is alert awake oriented and in no distress.  Patient walks half an hour a day on a daily basis.  Without any problems he does that 3-4 times a week.  At the time of my evaluation, the patient is alert awake oriented and in no distress.  Past Medical History:  Diagnosis Date  . Anemia 11/28/2019  . CAD (coronary artery disease) 11/28/2019  . Coronary arteriosclerosis 11/28/2019  . Coronary artery disease   . Diabetes mellitus type 2, controlled (HCC) 2016  . Essential hypertension   . Hematuria 06/19/2020  . History of kidney stones   . History of non-ST elevation myocardial infarction (NSTEMI) 11/15/2019  . Hydronephrosis with renal and ureteral calculus obstruction 11/15/2019  . Hyperlipidemia   . Hyponatremia 11/15/2019  . Myocardial infarction (HCC) 11/2019  . Nephrolithiasis   . Sepsis (HCC) 11/15/2019  . 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 Type 2 diabetes mellitus with hyperlipidemia (HCC) 11/15/2019    Past Surgical History:  Procedure Laterality Date  . CARDIAC CATHETERIZATION    . CORONARY BALLOON ANGIOPLASTY N/A 12/02/2019   Procedure: CORONARY BALLOON ANGIOPLASTY;  Surgeon: Corky Crafts, MD;  Location[facility] INVASIVE CV LAB;  Service: Cardiovascular;  Laterality: N/A;  . CORONARY STENT INTERVENTION N/A 12/02/2019   Procedure: CORONARY STENT INTERVENTION;  Surgeon: Lance Muss  S, MD;  Location: [facility] INVASIVE CV LAB;  Service: Cardiovascular;  Laterality: N/A;  . INTRAVASCULAR ULTRASOUND/IVUS N/A 12/02/2019   Procedure: Intravascular Ultrasound/IVUS;  Surgeon: Corky Crafts, MD;  Location: [facility] INVASIVE CV LAB;  Service: Cardiovascular;  Laterality: N/A;  . IR NEPHROSTOMY EXCHANGE LEFT  12/28/2019  . IR NEPHROSTOMY EXCHANGE LEFT  02/09/2020  . IR NEPHROSTOMY EXCHANGE  LEFT  03/22/2020  . IR NEPHROSTOMY EXCHANGE LEFT  05/03/2020  . IR NEPHROSTOMY PLACEMENT LEFT  11/16/2019  . LEFT HEART CATH AND CORONARY ANGIOGRAPHY N/A 12/02/2019   Procedure: LEFT HEART CATH AND CORONARY ANGIOGRAPHY;  Surgeon: Corky Crafts, MD;  Location: [facility] INVASIVE CV LAB;  Service: Cardiovascular;  Laterality: N/A;  . LITHOTRIPSY    . NEPHROLITHOTOMY Left 06/13/2020   Procedure: NEPHROLITHOTOMY PERCUTANEOUS;  Surgeon: Sebastian Ache, MD;  Location: [facility] ORS;  Service: Urology;  Laterality: Left;  3 HRS  . TUBAL LIGATION  1984  . URETEROSCOPY Left 06/13/2020   Procedure: URETEROSCOPY WITH BASKETING AND STENT PLACEMENT;  Surgeon: Sebastian Ache, MD;  Location: [facility] ORS;  Service: Urology;  Laterality: Left;    Current Medications: Current Meds  Medication Sig  . acetaminophen (TYLENOL) 325 MG tablet Take 2 tablets (650 mg total) by mouth every 6 (six) hours as needed for mild pain (or Fever >/= 101).  Marland Kitchen alendronate (FOSAMAX) 70 MG tablet Take 70 mg by mouth once a week. Take with a full glass of water on an empty stomach.  Marland Kitchen aspirin EC 81 MG EC tablet Take 1 tablet (81 mg total) by mouth daily.  Marland Kitchen atorvastatin (LIPITOR) 40 MG tablet Take 1 tablet (40 mg total) by mouth daily.  . Cholecalciferol (VITAMIN D-3) 125 MCG (5000 UT) TABS Take 5,000 Units by mouth daily.  . enalapril (VASOTEC) 20 MG tablet Take 20 mg by mouth daily.  . ferrous sulfate 325 (65 FE) MG tablet Take 325 mg by mouth daily.  . fluticasone (FLONASE) 50 MCG/ACT nasal spray Place 1-2 sprays into both nostrils daily as needed for allergies or rhinitis.  . indapamide (LOZOL) 2.5 MG tablet Take 1.25 mg by mouth daily.  . metFORMIN (GLUCOPHAGE) 1000 MG tablet Take 1,000 mg by mouth 2 (two) times daily with a meal.  . metoprolol tartrate (LOPRESSOR) 25 MG tablet Take 0.5 tablets (12.5 mg total) by mouth 2 (two) times daily.  . nitroGLYCERIN (NITROSTAT) 0.4 MG SL tablet Place 1 tablet (0.4 mg total) under the tongue every 5  (five) minutes as needed for chest pain (up to 3 doses. If taking 3rd dose call 911).  . polyethylene glycol (MIRALAX / GLYCOLAX) 17 g packet Take 17 g by mouth daily as needed for mild constipation.  . sertraline (ZOLOFT) 50 MG tablet Take 50 mg by mouth daily.  . sitaGLIPtin (JANUVIA) 25 MG tablet Take 25 mg by mouth at bedtime.   . vitamin B-12 (CYANOCOBALAMIN) 1000 MCG tablet Take 1,000 mcg by mouth daily.     Allergies:   Patient has no known allergies.   Social History   Socioeconomic History  . Marital status: Married    Spouse name: Not on file  . Number of children: Not on file  . Years of education: Not on file  . Highest education level: Not on file  Occupational History  . Not on file  Tobacco Use  . Smoking status: Never Smoker  . Smokeless tobacco: Never Used  Vaping Use  . Vaping Use: Never used  Substance and Sexual Activity  . Alcohol  use: Never  . Drug use: Never  . Sexual activity: Yes    Partners: Male  Other Topics Concern  . Not on file  Social History Narrative  . Not on file   Social Determinants of Health   Financial Resource Strain: Not on file  Food Insecurity: Not on file  Transportation Needs: Not on file  Physical Activity: Not on file  Stress: Not on file  Social Connections: Not on file     Family History: The patient's family history includes Heart attack in her brother, father, and mother.  ROS:   Please see the history of present illness.    All other systems reviewed and are negative.  EKGs/Labs/Other Studies Reviewed:    The following studies were reviewed today: I discussed my findings with the patient at length.  CORONARY BALLOON ANGIOPLASTY  CORONARY STENT INTERVENTION  Intravascular Ultrasound/IVUS  LEFT HEART CATH AND CORONARY ANGIOGRAPHY    Conclusion    Mid LAD-2 lesion is 80% stenosed.  A drug-eluting stent was successfully placed using a SYNERGY XD 2.50X24.  Post intervention, there is a 0% residual  stenosis.  Mid LAD-1 lesion is 70% stenosed.  A drug-eluting stent was successfully placed using a SYNERGY XD 2.75X8.  Post intervention, there is a 0% residual stenosis.  2nd Diag lesion is 75% stenosed.  Balloon angioplasty was performed using a BALLOON SAPPHIRE 2.25X15.  Post intervention, there is a 25% residual stenosis.  The left ventricular systolic function is normal.  LV end diastolic pressure is normal.  The left ventricular ejection fraction is 55-65% by visual estimate.  There is no aortic valve stenosis.   Complex, mid LAD bifurcation lesion.  Size differential from proximal to mid LAD due to large diagonals originating from LAD.  Difficult to lay out mid LAD.  IVUS was helpful to show actual vessel size and guide postdilatation.      Recent Labs: 02/01/2020: TSH 1.500 06/18/2020: ALT 24 06/21/2020: BUN 24; Creatinine, Ser 1.08; Hemoglobin 9.3; Platelets 258; Potassium 3.2; Sodium 129  Recent Lipid Panel    Component Value Date/Time   CHOL 99 (L) 02/01/2020 0856   TRIG 151 (H) 02/01/2020 0856   HDL 32 (L) 02/01/2020 0856   CHOLHDL 3.1 02/01/2020 0856   LDLCALC 41 02/01/2020 0856    Physical Exam:    VS:  BP 138/68   Pulse 70   Ht 5\' 4"  (1.626 m)   Wt 143 lb 3.2 oz (65 kg)   SpO2 95%   BMI 24.58 kg/m     Wt Readings from Last 3 Encounters:  11/02/20 143 lb 3.2 oz (65 kg)  06/20/20 137 lb 15.1 oz (62.6 kg)  06/13/20 138 lb 7.2 oz (62.8 kg)     GEN: Patient is in no acute distress HEENT: Normal NECK: No JVD; No carotid bruits LYMPHATICS: No lymphadenopathy CARDIAC: Hear sounds regular, 2/6 systolic murmur at the apex. RESPIRATORY:  Clear to auscultation without rales, wheezing or rhonchi  ABDOMEN: Soft, non-tender, non-distended MUSCULOSKELETAL:  No edema; No deformity  SKIN: Warm and dry NEUROLOGIC:  Alert and oriented x 3 PSYCHIATRIC:  Normal affect   Signed, 06/15/20, MD  11/02/2020 1:09 PM    [facility]

## 2020-11-11 IMAGING — XA IR NEPHROSTOMY PLACEMENT LEFT
3 series · 3 of 3 positions shown · non-contrast
Comparison: 11/14/2019

INDICATION: Obstructing nephrolithiasis, hydronephrosis

EXAM:
ULTRASOUND FLUOROSCOPIC 10 FRENCH LEFT NEPHROSTOMY INSERTION

[Series 1: fl (-) angio · 1 of 1 slices shown (1 of 3)]
[im 1/1]
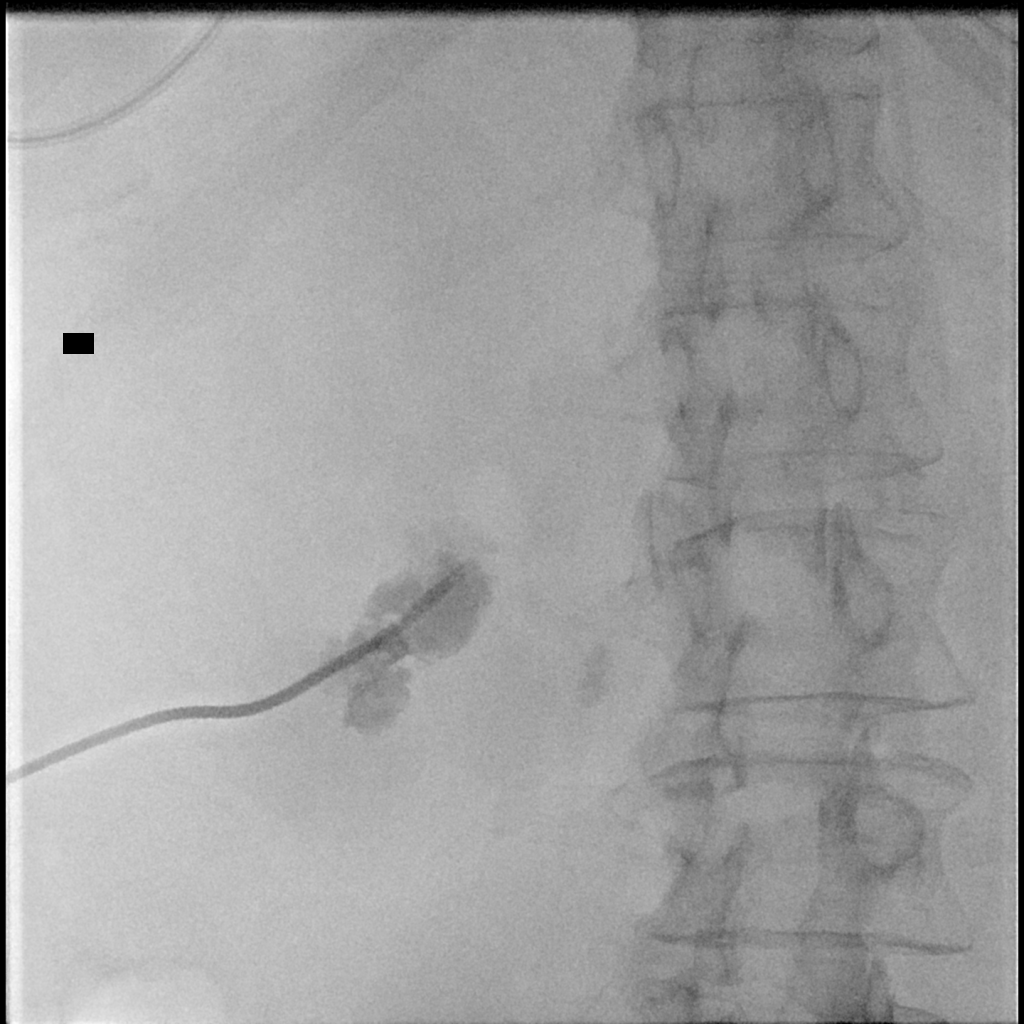

[Series 2: fl (-) angio · 1 of 1 slices shown (2 of 3)]
[im 1/1]
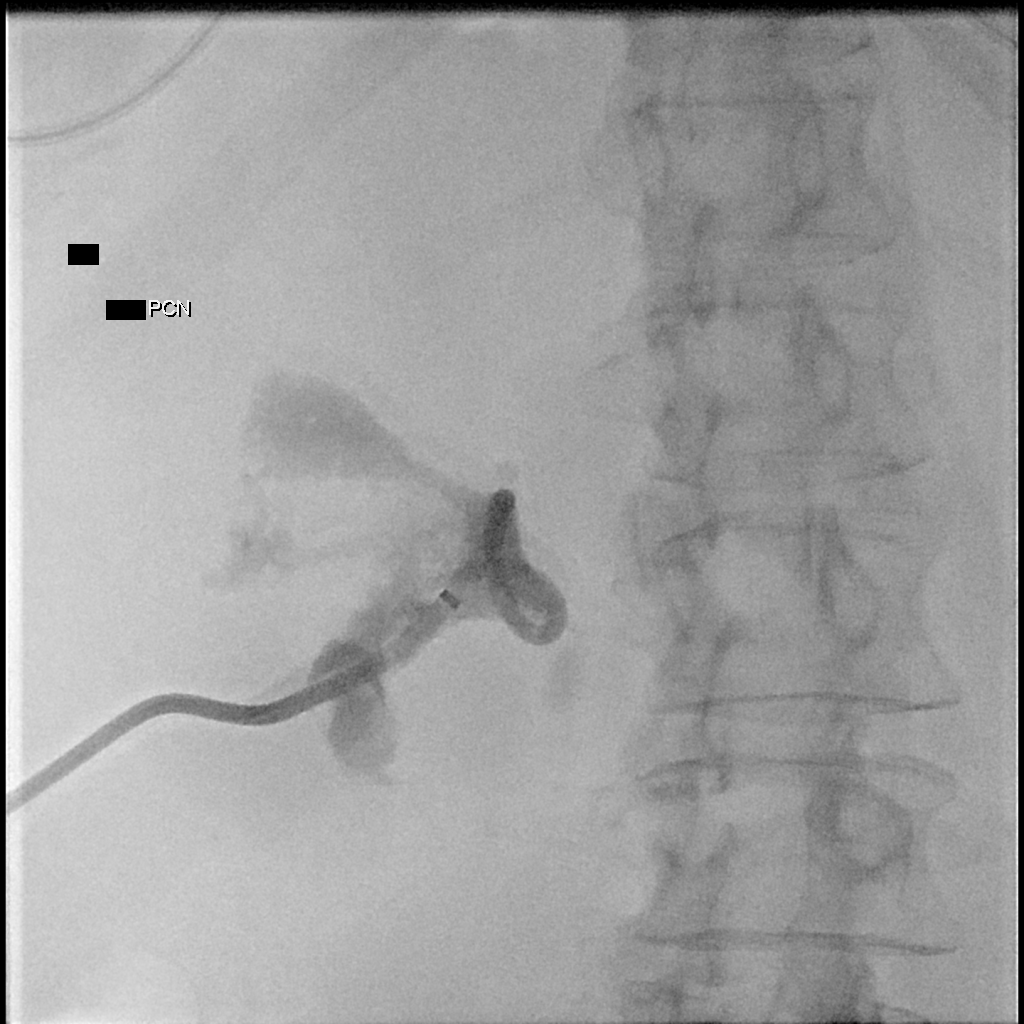

[Series 3: fl (-) angio · 1 of 1 slices shown (3 of 3)]
[im 1/1]
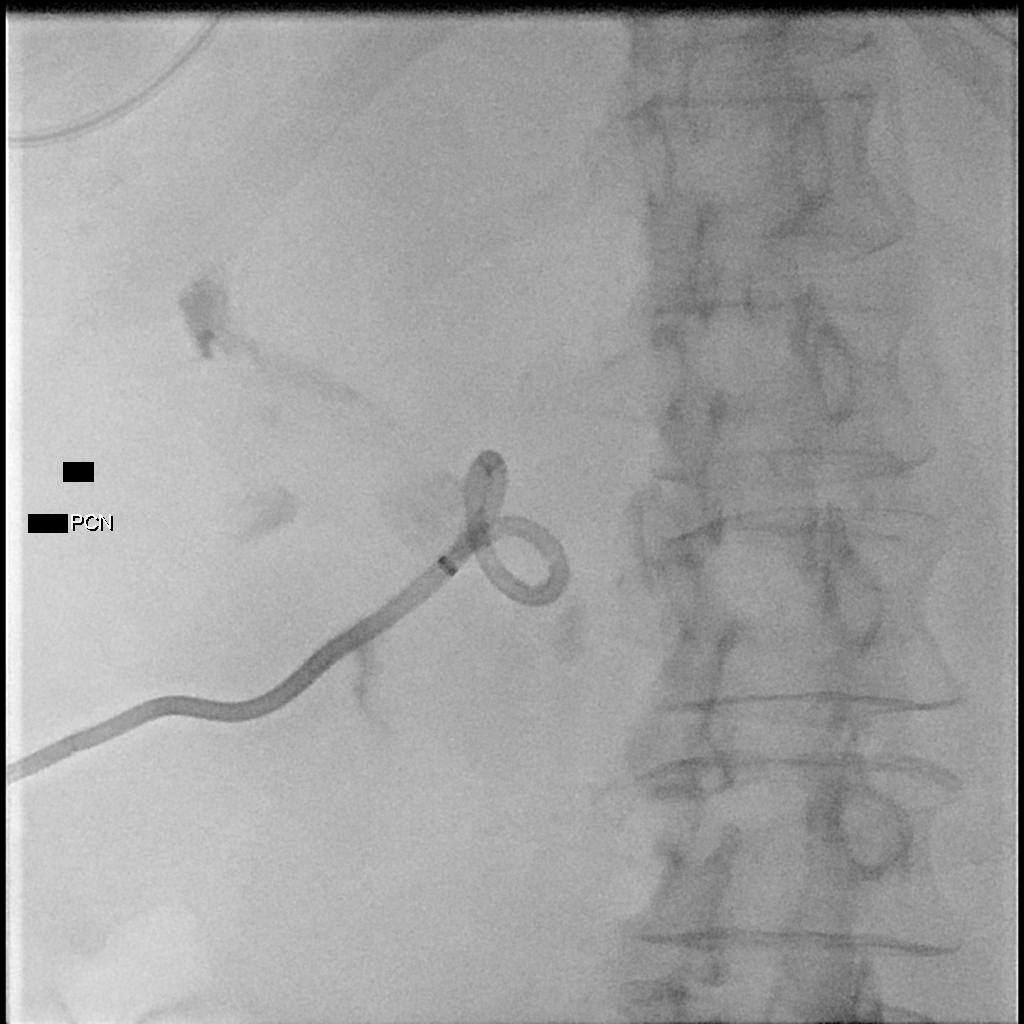

[3 of 3 positions shown; findings below may reference images not displayed]

MEDICATIONS:
Patient is already receiving IV antibiotics as an inpatient.

ANESTHESIA/SEDATION:
Fentanyl 50 mcg IV; Versed 1.0 mg IV

Moderate Sedation Time:  11 minutes

The patient was continuously monitored during the procedure by the
interventional radiology nurse under my direct supervision.

CONTRAST:  5 cc-administered into the collecting system(s)

FLUOROSCOPY TIME:  Fluoroscopy Time: 0 minutes 6 seconds (5 mGy).

COMPLICATIONS:
None immediate.

PROCEDURE:
Informed written consent was obtained from the patient after a
thorough discussion of the procedural risks, benefits and
alternatives. All questions were addressed. Maximal Sterile Barrier
Technique was utilized including caps, mask, sterile gowns, sterile
gloves, sterile drape, hand hygiene and skin antiseptic. A timeout
was performed prior to the initiation of the procedure.

Previous imaging reviewed. Patient position prone. Preliminary
ultrasound performed. The hydronephrotic left kidney was localized
and marked.

Under sterile conditions and local anesthesia, percutaneous needle
access performed of a mid to lower pole calyx. Needle position
confirmed with ultrasound. Images obtained for documentation. There
was return of purulent urine. Sample sent for culture. Guidewire
inserted followed by the Accustick dilator set. Amplatz guidewire
inserted into the renal pelvis. Tract dilatation performed to insert
a 10 French nephrostomy. Nephrostomy catheter confirmed in the renal
pelvis. Contrast injection confirms position. Syringe aspiration
yielded purulent urine. Catheter secured with Prolene suture and
connected to external gravity drainage bag. Sterile dressing
applied. No immediate complication. Patient tolerated the procedure
well.
IMPRESSION: Successful ultrasound and fluoroscopic 10 French left nephrostomy
insertion

## 2020-12-13 DIAGNOSIS — H6121 Impacted cerumen, right ear: Secondary | ICD-10-CM | POA: Diagnosis not present

## 2020-12-13 DIAGNOSIS — Z6825 Body mass index (BMI) 25.0-25.9, adult: Secondary | ICD-10-CM | POA: Diagnosis not present

## 2020-12-13 DIAGNOSIS — E1121 Type 2 diabetes mellitus with diabetic nephropathy: Secondary | ICD-10-CM | POA: Diagnosis not present

## 2021-01-14 ENCOUNTER — Other Ambulatory Visit: Payer: Self-pay | Admitting: Cardiology

## 2021-05-03 ENCOUNTER — Encounter: Payer: Self-pay | Admitting: Cardiology

## 2021-05-03 ENCOUNTER — Other Ambulatory Visit: Payer: Self-pay

## 2021-05-03 ENCOUNTER — Ambulatory Visit: Payer: Medicare PPO | Admitting: Cardiology

## 2021-05-03 VITALS — BP 130/62 | HR 71 | Ht 63.0 in | Wt 147.6 lb

## 2021-05-03 DIAGNOSIS — I2510 Atherosclerotic heart disease of native coronary artery without angina pectoris: Secondary | ICD-10-CM

## 2021-05-03 DIAGNOSIS — I10 Essential (primary) hypertension: Secondary | ICD-10-CM

## 2021-05-03 DIAGNOSIS — E782 Mixed hyperlipidemia: Secondary | ICD-10-CM

## 2021-05-03 DIAGNOSIS — E1169 Type 2 diabetes mellitus with other specified complication: Secondary | ICD-10-CM

## 2021-05-03 DIAGNOSIS — E785 Hyperlipidemia, unspecified: Secondary | ICD-10-CM

## 2021-05-03 NOTE — Progress Notes (Signed)
Cardiology Office Note:    Date:  05/03/2021   ID:  Claudia Christian, DOB [birth year], MRN [id number]  PCP:  Buckner Malta, MD  Cardiologist:  Garwin Brothers, MD   Referring MD: Buckner Malta, MD    ASSESSMENT:    1. Coronary artery disease involving native coronary artery of native heart without angina pectoris   2. Essential hypertension   3. Mixed hyperlipidemia   4. Type 2 diabetes mellitus with hyperlipidemia (HCC)    PLAN:    In order of problems listed above:  Coronary artery disease: Secondary prevention stressed with the patient.  Importance of compliance with diet medication stressed and she vocalized understanding.  I congratulated her on her exercise protocol Essential hypertension: Blood pressure stable and lifestyle modification was appreciated. Mixed dyslipidemia: Lipids from last evaluation are fine.  She is fasting today and will do complete blood work. Diabetes mellitus: Diet emphasized.  We will do hemoglobin A1c this morning. Vitamin D deficiency: Patient gives a history of this and we will check it for her. Patient will be seen in follow-up appointment in 6 months or earlier if the patient has any concerns    Medication Adjustments/Labs and Tests Ordered: Current medicines are reviewed at length with the patient today.  Concerns regarding medicines are outlined above.  Orders Placed This Encounter  Procedures   CBC with Differential/Platelet   TSH   Lipid panel   Hemoglobin A1c   Hepatic function panel   VITAMIN D 25 Hydroxy (Vit-D Deficiency, Fractures)   Basic metabolic panel   EKG 12-Lead    No orders of the defined types were placed in this encounter.    No chief complaint on file.    History of Present Illness:    Claudia Christian is a 75 y.o. female.  Patient has past medical history of coronary artery disease, essential hypertension, dyslipidemia and diabetes mellitus.  She denies any problems at this time and takes care of  activities of daily living.  No chest pain orthopnea or PND.  She walks half an hour a day on a daily basis without any symptoms.  At the time of my evaluation, the patient is alert awake oriented and in no distress.  Past Medical History:  Diagnosis Date   Anemia 11/28/2019   CAD (coronary artery disease) 11/28/2019   Coronary arteriosclerosis 11/28/2019   Coronary artery disease    Diabetes mellitus type 2, controlled (HCC) 2016   Essential hypertension    Hematuria 06/19/2020   History of kidney stones    History of non-ST elevation myocardial infarction (NSTEMI) 11/15/2019   Hydronephrosis with renal and ureteral calculus obstruction 11/15/2019   Hyperlipidemia    Hyponatremia 11/15/2019   Myocardial infarction (HCC) 11/2019   Nephrolithiasis    Sepsis (HCC) 11/15/2019   Staghorn calculus 06/13/2020   Type 2 diabetes mellitus with hyperlipidemia (HCC) 11/15/2019    Past Surgical History:  Procedure Laterality Date   CARDIAC CATHETERIZATION     CORONARY BALLOON ANGIOPLASTY N/A 12/02/2019   Procedure: CORONARY BALLOON ANGIOPLASTY;  Surgeon: Corky Crafts, MD;  Location: [facility] INVASIVE CV LAB;  Service: Cardiovascular;  Laterality: N/A;   CORONARY STENT INTERVENTION N/A 12/02/2019   Procedure: CORONARY STENT INTERVENTION;  Surgeon: Corky Crafts, MD;  Location: [facility] INVASIVE CV LAB;  Service: Cardiovascular;  Laterality: N/A;   INTRAVASCULAR ULTRASOUND/IVUS N/A 12/02/2019   Procedure: Intravascular Ultrasound/IVUS;  Surgeon: Corky Crafts, MD;  Location: [facility] INVASIVE CV LAB;  Service: Cardiovascular;  Laterality: N/A;  IR NEPHROSTOMY EXCHANGE LEFT  12/28/2019   IR NEPHROSTOMY EXCHANGE LEFT  02/09/2020   IR NEPHROSTOMY EXCHANGE LEFT  03/22/2020   IR NEPHROSTOMY EXCHANGE LEFT  05/03/2020   IR NEPHROSTOMY PLACEMENT LEFT  11/16/2019   LEFT HEART CATH AND CORONARY ANGIOGRAPHY N/A 12/02/2019   Procedure: LEFT HEART CATH AND CORONARY ANGIOGRAPHY;  Surgeon: Corky Crafts, MD;   Location: [facility] INVASIVE CV LAB;  Service: Cardiovascular;  Laterality: N/A;   LITHOTRIPSY     NEPHROLITHOTOMY Left 06/13/2020   Procedure: NEPHROLITHOTOMY PERCUTANEOUS;  Surgeon: Sebastian Ache, MD;  Location: [facility] ORS;  Service: Urology;  Laterality: Left;  3 HRS   TUBAL LIGATION  1984   URETEROSCOPY Left 06/13/2020   Procedure: URETEROSCOPY WITH BASKETING AND STENT PLACEMENT;  Surgeon: Sebastian Ache, MD;  Location: [facility] ORS;  Service: Urology;  Laterality: Left;    Current Medications: Current Meds  Medication Sig   acetaminophen (TYLENOL) 325 MG tablet Take 2 tablets (650 mg total) by mouth every 6 (six) hours as needed for mild pain (or Fever >/= 101).   alendronate (FOSAMAX) 70 MG tablet Take 70 mg by mouth once a week. Take with a full glass of water on an empty stomach.   aspirin EC 81 MG EC tablet Take 1 tablet (81 mg total) by mouth daily.   atorvastatin (LIPITOR) 40 MG tablet Take 40 mg by mouth daily.   Cholecalciferol (VITAMIN D-3) 125 MCG (5000 UT) TABS Take 5,000 Units by mouth daily.   enalapril (VASOTEC) 20 MG tablet Take 20 mg by mouth daily.   ferrous sulfate 325 (65 FE) MG tablet Take 325 mg by mouth daily.   fluticasone (FLONASE) 50 MCG/ACT nasal spray Place 1-2 sprays into both nostrils daily as needed for allergies or rhinitis.   indapamide (LOZOL) 2.5 MG tablet Take 1.25 mg by mouth 2 (two) times daily.   metFORMIN (GLUCOPHAGE) 1000 MG tablet Take 1,000 mg by mouth 2 (two) times daily with a meal.   metoprolol tartrate (LOPRESSOR) 25 MG tablet Take 12.5 mg by mouth 2 (two) times daily.   nitroGLYCERIN (NITROSTAT) 0.4 MG SL tablet Place 0.4 mg under the tongue every 5 (five) minutes as needed for chest pain.   polyethylene glycol (MIRALAX / GLYCOLAX) 17 g packet Take 17 g by mouth daily as needed for mild constipation.   sertraline (ZOLOFT) 50 MG tablet Take 50 mg by mouth daily.   sitaGLIPtin (JANUVIA) 25 MG tablet Take 25 mg by mouth at bedtime.    vitamin B-12  (CYANOCOBALAMIN) 1000 MCG tablet Take 1,000 mcg by mouth daily.     Allergies:   Patient has no known allergies.   Social History   Socioeconomic History   Marital status: Married    Spouse name: Not on file   Number of children: Not on file   Years of education: Not on file   Highest education level: Not on file  Occupational History   Not on file  Tobacco Use   Smoking status: Never   Smokeless tobacco: Never  Vaping Use   Vaping Use: Never used  Substance and Sexual Activity   Alcohol use: Never   Drug use: Never   Sexual activity: Yes    Partners: Male  Other Topics Concern   Not on file  Social History Narrative   Not on file   Social Determinants of Health   Financial Resource Strain: Not on file  Food Insecurity: Not on file  Transportation Needs: Not on file  Physical  Activity: Not on file  Stress: Not on file  Social Connections: Not on file     Family History: The patient's family history includes Heart attack in her brother, father, and mother.  ROS:   Please see the history of present illness.    All other systems reviewed and are negative.  EKGs/Labs/Other Studies Reviewed:    The following studies were reviewed today: I discussed my findings with the patient at length   Recent Labs: 06/18/2020: ALT 24 06/21/2020: BUN 24; Creatinine, Ser 1.08; Hemoglobin 9.3; Platelets 258; Potassium 3.2; Sodium 129  Recent Lipid Panel    Component Value Date/Time   CHOL 99 (L) 02/01/2020 0856   TRIG 151 (H) 02/01/2020 0856   HDL 32 (L) 02/01/2020 0856   CHOLHDL 3.1 02/01/2020 0856   LDLCALC 41 02/01/2020 0856    Physical Exam:    VS:  BP 130/62   Pulse 71   Ht 5\' 3"  (1.6 m)   Wt 147 lb 9.6 oz (67 kg)   SpO2 98%   BMI 26.15 kg/m     Wt Readings from Last 3 Encounters:  05/03/21 147 lb 9.6 oz (67 kg)  11/02/20 143 lb 3.2 oz (65 kg)  06/20/20 137 lb 15.1 oz (62.6 kg)     GEN: Patient is in no acute distress HEENT: Normal NECK: No JVD; No  carotid bruits LYMPHATICS: No lymphadenopathy CARDIAC: Hear sounds regular, 2/6 systolic murmur at the apex. RESPIRATORY:  Clear to auscultation without rales, wheezing or rhonchi  ABDOMEN: Soft, non-tender, non-distended MUSCULOSKELETAL:  No edema; No deformity  SKIN: Warm and dry NEUROLOGIC:  Alert and oriented x 3 PSYCHIATRIC:  Normal affect   Signed, 08/20/20, MD  05/03/2021 8:19 AM    [facility]

## 2021-05-03 NOTE — Patient Instructions (Signed)
Medication Instructions:  No medication changes. *If you need a refill on your cardiac medications before your next appointment, please call your pharmacy*   Lab Work: Your physician recommends that you have labs done in the office today. Your test included  basic metabolic panel, complete blood count, TSH, hgb A1C, liver function and lipids.  If you have labs (blood work) drawn today and your tests are completely normal, you will receive your results only by: MyChart Message (if you have MyChart) OR A paper copy in the mail If you have any lab test that is abnormal or we need to change your treatment, we will call you to review the results.   Testing/Procedures: None ordered   Follow-Up: At [facility], you and your health needs are our priority.  As part of our continuing mission to provide you with exceptional heart care, we have created designated Provider Care Teams.  These Care Teams include your primary Cardiologist (physician) and Advanced Practice Providers (APPs -  Physician Assistants and Nurse Practitioners) who all work together to provide you with the care you need, when you need it.  We recommend signing up for the patient portal called "MyChart".  Sign up information is provided on this After Visit Summary.  MyChart is used to connect with patients for Virtual Visits (Telemedicine).  Patients are able to view lab/test results, encounter notes, upcoming appointments, etc.  Non-urgent messages can be sent to your provider as well.   To learn more about what you can do with MyChart, go to [email or web address].    Your next appointment:   6 month(s)  The format for your next appointment:   In Person  Provider:   Belva Crome, MD   Other Instructions NA

## 2021-05-04 LAB — CBC WITH DIFFERENTIAL/PLATELET
Basophils Absolute: 0 10*3/uL (ref 0.0–0.2)
Basos: 1 %
EOS (ABSOLUTE): 0.2 10*3/uL (ref 0.0–0.4)
Eos: 2 %
Hematocrit: 32.6 % — ABNORMAL LOW (ref 34.0–46.6)
Hemoglobin: 10.8 g/dL — ABNORMAL LOW (ref 11.1–15.9)
Immature Grans (Abs): 0 10*3/uL (ref 0.0–0.1)
Immature Granulocytes: 0 %
Lymphocytes Absolute: 1.9 10*3/uL (ref 0.7–3.1)
Lymphs: 25 %
MCH: 28.8 pg (ref 26.6–33.0)
MCHC: 33.1 g/dL (ref 31.5–35.7)
MCV: 87 fL (ref 79–97)
Monocytes Absolute: 0.4 10*3/uL (ref 0.1–0.9)
Monocytes: 6 %
Neutrophils Absolute: 5 10*3/uL (ref 1.4–7.0)
Neutrophils: 66 %
Platelets: 217 10*3/uL (ref 150–450)
RBC: 3.75 x10E6/uL — ABNORMAL LOW (ref 3.77–5.28)
RDW: 13.2 % (ref 11.7–15.4)
WBC: 7.5 10*3/uL (ref 3.4–10.8)

## 2021-05-04 LAB — HEPATIC FUNCTION PANEL
ALT: 11 IU/L (ref 0–32)
AST: 12 IU/L (ref 0–40)
Albumin: 4.5 g/dL (ref 3.7–4.7)
Alkaline Phosphatase: 53 IU/L (ref 44–121)
Bilirubin Total: 0.3 mg/dL (ref 0.0–1.2)
Bilirubin, Direct: <0.1 mg/dL (ref 0.00–0.40)
Total Protein: 7 g/dL (ref 6.0–8.5)

## 2021-05-04 LAB — BASIC METABOLIC PANEL
BUN/Creatinine Ratio: 17 (ref 12–28)
BUN: 17 mg/dL (ref 8–27)
CO2: 23 mmol/L (ref 20–29)
Calcium: 9.8 mg/dL (ref 8.7–10.3)
Chloride: 88 mmol/L — ABNORMAL LOW (ref 96–106)
Creatinine, Ser: 0.99 mg/dL (ref 0.57–1.00)
Glucose: 103 mg/dL — ABNORMAL HIGH (ref 65–99)
Potassium: 4.3 mmol/L (ref 3.5–5.2)
Sodium: 127 mmol/L — ABNORMAL LOW (ref 134–144)
eGFR: 61 mL/min/{1.73_m2} (ref >59)

## 2021-05-04 LAB — LIPID PANEL
Chol/HDL Ratio: 3 ratio (ref 0.0–4.4)
Cholesterol, Total: 116 mg/dL (ref 100–199)
HDL: 39 mg/dL — ABNORMAL LOW (ref >39)
LDL Chol Calc (NIH): 56 mg/dL (ref 0–99)
Triglycerides: 113 mg/dL (ref 0–149)
VLDL Cholesterol Cal: 21 mg/dL (ref 5–40)

## 2021-05-04 LAB — HEMOGLOBIN A1C
Est. average glucose Bld gHb Est-mCnc: 140 mg/dL
Hgb A1c MFr Bld: 6.5 % — ABNORMAL HIGH (ref 4.8–5.6)

## 2021-05-04 LAB — VITAMIN D 25 HYDROXY (VIT D DEFICIENCY, FRACTURES): Vit D, 25-Hydroxy: 86.8 ng/mL (ref 30.0–100.0)

## 2021-05-04 LAB — TSH: TSH: 1.31 u[IU]/mL (ref 0.450–4.500)

## 2021-07-30 DIAGNOSIS — E1121 Type 2 diabetes mellitus with diabetic nephropathy: Secondary | ICD-10-CM | POA: Diagnosis not present

## 2021-08-01 DIAGNOSIS — E871 Hypo-osmolality and hyponatremia: Secondary | ICD-10-CM | POA: Diagnosis not present

## 2021-08-05 DIAGNOSIS — F324 Major depressive disorder, single episode, in partial remission: Secondary | ICD-10-CM | POA: Diagnosis not present

## 2021-08-05 DIAGNOSIS — E871 Hypo-osmolality and hyponatremia: Secondary | ICD-10-CM | POA: Diagnosis not present

## 2021-08-05 DIAGNOSIS — D509 Iron deficiency anemia, unspecified: Secondary | ICD-10-CM | POA: Diagnosis not present

## 2021-08-05 DIAGNOSIS — Z9181 History of falling: Secondary | ICD-10-CM | POA: Diagnosis not present

## 2021-08-05 DIAGNOSIS — Z6825 Body mass index (BMI) 25.0-25.9, adult: Secondary | ICD-10-CM | POA: Diagnosis not present

## 2021-08-05 DIAGNOSIS — E1121 Type 2 diabetes mellitus with diabetic nephropathy: Secondary | ICD-10-CM | POA: Diagnosis not present

## 2021-08-05 DIAGNOSIS — Z0000 Encounter for general adult medical examination without abnormal findings: Secondary | ICD-10-CM | POA: Diagnosis not present

## 2021-08-05 DIAGNOSIS — I25119 Atherosclerotic heart disease of native coronary artery with unspecified angina pectoris: Secondary | ICD-10-CM | POA: Diagnosis not present

## 2021-08-22 DIAGNOSIS — Z1231 Encounter for screening mammogram for malignant neoplasm of breast: Secondary | ICD-10-CM | POA: Diagnosis not present

## 2021-09-06 DIAGNOSIS — H2513 Age-related nuclear cataract, bilateral: Secondary | ICD-10-CM | POA: Diagnosis not present

## 2021-09-10 DIAGNOSIS — R82991 Hypocitraturia: Secondary | ICD-10-CM | POA: Diagnosis not present

## 2021-09-17 DIAGNOSIS — R82998 Other abnormal findings in urine: Secondary | ICD-10-CM | POA: Diagnosis not present

## 2021-09-17 DIAGNOSIS — N202 Calculus of kidney with calculus of ureter: Secondary | ICD-10-CM | POA: Diagnosis not present

## 2021-10-03 DIAGNOSIS — Z23 Encounter for immunization: Secondary | ICD-10-CM | POA: Diagnosis not present

## 2021-11-14 ENCOUNTER — Ambulatory Visit: Payer: Medicare PPO | Admitting: Cardiology

## 2021-12-16 DIAGNOSIS — N3001 Acute cystitis with hematuria: Secondary | ICD-10-CM | POA: Diagnosis not present

## 2021-12-16 DIAGNOSIS — Z6826 Body mass index (BMI) 26.0-26.9, adult: Secondary | ICD-10-CM | POA: Diagnosis not present

## 2022-01-03 DIAGNOSIS — Z6824 Body mass index (BMI) 24.0-24.9, adult: Secondary | ICD-10-CM | POA: Diagnosis not present

## 2022-01-03 DIAGNOSIS — R5383 Other fatigue: Secondary | ICD-10-CM | POA: Diagnosis not present

## 2022-01-03 DIAGNOSIS — R5381 Other malaise: Secondary | ICD-10-CM | POA: Diagnosis not present

## 2022-01-03 DIAGNOSIS — Z8616 Personal history of COVID-19: Secondary | ICD-10-CM | POA: Diagnosis not present

## 2022-01-03 DIAGNOSIS — I951 Orthostatic hypotension: Secondary | ICD-10-CM | POA: Diagnosis not present

## 2022-01-06 DIAGNOSIS — N3091 Cystitis, unspecified with hematuria: Secondary | ICD-10-CM | POA: Diagnosis not present

## 2022-01-07 DIAGNOSIS — U099 Post covid-19 condition, unspecified: Secondary | ICD-10-CM | POA: Diagnosis not present

## 2022-01-07 DIAGNOSIS — E876 Hypokalemia: Secondary | ICD-10-CM | POA: Diagnosis not present

## 2022-01-07 DIAGNOSIS — Z6825 Body mass index (BMI) 25.0-25.9, adult: Secondary | ICD-10-CM | POA: Diagnosis not present

## 2022-01-07 DIAGNOSIS — N3001 Acute cystitis with hematuria: Secondary | ICD-10-CM | POA: Diagnosis not present

## 2022-01-07 DIAGNOSIS — E871 Hypo-osmolality and hyponatremia: Secondary | ICD-10-CM | POA: Diagnosis not present

## 2022-01-07 DIAGNOSIS — G9332 Myalgic encephalomyelitis/chronic fatigue syndrome: Secondary | ICD-10-CM | POA: Diagnosis not present

## 2022-01-28 DIAGNOSIS — E1121 Type 2 diabetes mellitus with diabetic nephropathy: Secondary | ICD-10-CM | POA: Diagnosis not present

## 2022-01-28 DIAGNOSIS — N3001 Acute cystitis with hematuria: Secondary | ICD-10-CM | POA: Diagnosis not present

## 2022-01-28 DIAGNOSIS — E871 Hypo-osmolality and hyponatremia: Secondary | ICD-10-CM | POA: Diagnosis not present

## 2022-01-28 DIAGNOSIS — Z6825 Body mass index (BMI) 25.0-25.9, adult: Secondary | ICD-10-CM | POA: Diagnosis not present

## 2022-02-18 DIAGNOSIS — N3001 Acute cystitis with hematuria: Secondary | ICD-10-CM | POA: Diagnosis not present

## 2022-02-18 DIAGNOSIS — Z79899 Other long term (current) drug therapy: Secondary | ICD-10-CM | POA: Diagnosis not present

## 2022-02-18 DIAGNOSIS — E1121 Type 2 diabetes mellitus with diabetic nephropathy: Secondary | ICD-10-CM | POA: Diagnosis not present

## 2022-02-18 DIAGNOSIS — Z113 Encounter for screening for infections with a predominantly sexual mode of transmission: Secondary | ICD-10-CM | POA: Diagnosis not present

## 2022-02-18 DIAGNOSIS — Z6825 Body mass index (BMI) 25.0-25.9, adult: Secondary | ICD-10-CM | POA: Diagnosis not present

## 2022-02-24 ENCOUNTER — Ambulatory Visit: Payer: Medicare PPO | Admitting: Cardiology

## 2022-04-03 DIAGNOSIS — Z6825 Body mass index (BMI) 25.0-25.9, adult: Secondary | ICD-10-CM | POA: Diagnosis not present

## 2022-04-03 DIAGNOSIS — N3001 Acute cystitis with hematuria: Secondary | ICD-10-CM | POA: Diagnosis not present

## 2022-04-08 ENCOUNTER — Ambulatory Visit: Payer: Medicare PPO | Admitting: Cardiology

## 2022-04-28 DIAGNOSIS — N3000 Acute cystitis without hematuria: Secondary | ICD-10-CM | POA: Diagnosis not present

## 2022-05-02 ENCOUNTER — Encounter: Payer: Self-pay | Admitting: Cardiology

## 2022-05-02 ENCOUNTER — Ambulatory Visit (INDEPENDENT_AMBULATORY_CARE_PROVIDER_SITE_OTHER): Payer: Medicare HMO | Admitting: Cardiology

## 2022-05-02 VITALS — BP 130/70 | HR 64 | Ht 62.0 in | Wt 150.2 lb

## 2022-05-02 DIAGNOSIS — I10 Essential (primary) hypertension: Secondary | ICD-10-CM

## 2022-05-02 DIAGNOSIS — E785 Hyperlipidemia, unspecified: Secondary | ICD-10-CM

## 2022-05-02 DIAGNOSIS — E782 Mixed hyperlipidemia: Secondary | ICD-10-CM

## 2022-05-02 DIAGNOSIS — I2510 Atherosclerotic heart disease of native coronary artery without angina pectoris: Secondary | ICD-10-CM | POA: Diagnosis not present

## 2022-05-02 DIAGNOSIS — E1169 Type 2 diabetes mellitus with other specified complication: Secondary | ICD-10-CM

## 2022-05-02 NOTE — Progress Notes (Signed)
Cardiology Office Note:    Date:  05/02/2022   ID:  Claudia Christian, DOB 1946-03-31, MRN [id number]  PCP:  Serita Grammes, MD  Cardiologist:  Jenean Lindau, MD   Referring MD: Serita Grammes, MD    ASSESSMENT:    1. Coronary artery disease involving native coronary artery of native heart without angina pectoris   2. Essential hypertension   3. Mixed hyperlipidemia   4. Type 2 diabetes mellitus with hyperlipidemia (HCC)    PLAN:    In order of problems listed above:  Coronary artery disease: Secondary prevention stressed with the patient.  Importance of compliance with diet medication stressed and she vocalized understanding.  She was advised to walk at least half an hour a day 5 days a week and she promises to do so. Essential hypertension: Blood pressure stable and diet was emphasized.  Lifestyle modification urged. Mixed dyslipidemia: On lipid-lowering therapy.  She will have blood work in the next few days.  Diet emphasized. Diabetes mellitus: Managed by primary care.  Diet and exercise stressed. Patient will be seen in follow-up appointment in 6 months or earlier if the patient has any concerns    Medication Adjustments/Labs and Tests Ordered: Current medicines are reviewed at length with the patient today.  Concerns regarding medicines are outlined above.  No orders of the defined types were placed in this encounter.  No orders of the defined types were placed in this encounter.    Chief Complaint  Patient presents with   Follow-up     History of Present Illness:    Claudia Christian is a 76 y.o. female.  Patient has past medical history of coronary artery disease, essential hypertension, mixed dyslipidemia and diabetes mellitus.  She denies any problems at this time and takes care of activities of daily living.  No chest pain orthopnea or PND.  At the time of my evaluation, the patient is alert awake oriented and in no distress.  She is cut down on walking  and leads a sedentary lifestyle because she had COVID a couple of months ago and is recovering from it.  Past Medical History:  Diagnosis Date   Anemia 11/28/2019   CAD (coronary artery disease) 11/28/2019   Coronary arteriosclerosis 11/28/2019   Coronary artery disease    Diabetes mellitus type 2, controlled ([place]) 2016   Essential hypertension    Hematuria 06/19/2020   History of kidney stones    History of non-ST elevation myocardial infarction (NSTEMI) 11/15/2019   Hydronephrosis with renal and ureteral calculus obstruction 11/15/2019   Hyperlipidemia    Hyponatremia 11/15/2019   Myocardial infarction ([place]) 11/2019   Nephrolithiasis    Sepsis ([place]) 11/15/2019   Staghorn calculus 06/13/2020   Type 2 diabetes mellitus with hyperlipidemia ([place]) 11/15/2019    Past Surgical History:  Procedure Laterality Date   CARDIAC CATHETERIZATION     CORONARY BALLOON ANGIOPLASTY N/A 12/02/2019   Procedure: CORONARY BALLOON ANGIOPLASTY;  Surgeon: Jettie Booze, MD;  Location: [place] CV LAB;  Service: Cardiovascular;  Laterality: N/A;   CORONARY STENT INTERVENTION N/A 12/02/2019   Procedure: CORONARY STENT INTERVENTION;  Surgeon: Jettie Booze, MD;  Location: [place] CV LAB;  Service: Cardiovascular;  Laterality: N/A;   INTRAVASCULAR ULTRASOUND/IVUS N/A 12/02/2019   Procedure: Intravascular Ultrasound/IVUS;  Surgeon: Jettie Booze, MD;  Location: [place] CV LAB;  Service: Cardiovascular;  Laterality: N/A;   IR NEPHROSTOMY EXCHANGE LEFT  12/28/2019   IR NEPHROSTOMY EXCHANGE LEFT  02/09/2020   IR  NEPHROSTOMY EXCHANGE LEFT  03/22/2020   IR NEPHROSTOMY EXCHANGE LEFT  05/03/2020   IR NEPHROSTOMY PLACEMENT LEFT  11/16/2019   LEFT HEART CATH AND CORONARY ANGIOGRAPHY N/A 12/02/2019   Procedure: LEFT HEART CATH AND CORONARY ANGIOGRAPHY;  Surgeon: Corky Crafts, MD;  Location: [facility] INVASIVE CV LAB;  Service: Cardiovascular;  Laterality: N/A;   LITHOTRIPSY     NEPHROLITHOTOMY  Left 06/13/2020   Procedure: NEPHROLITHOTOMY PERCUTANEOUS;  Surgeon: Sebastian Ache, MD;  Location: [facility];  Service: Urology;  Laterality: Left;  3 HRS   TUBAL LIGATION  1984   URETEROSCOPY Left 06/13/2020   Procedure: URETEROSCOPY WITH BASKETING AND STENT PLACEMENT;  Surgeon: Sebastian Ache, MD;  Location: [facility];  Service: Urology;  Laterality: Left;    Current Medications: Current Meds  Medication Sig   acetaminophen (TYLENOL) 325 MG tablet Take 2 tablets (650 mg total) by mouth every 6 (six) hours as needed for mild pain (or Fever >/= 101).   alendronate (FOSAMAX) 70 MG tablet Take 70 mg by mouth once a week. Take with a full glass of water on an empty stomach.   aspirin EC 81 MG EC tablet Take 1 tablet (81 mg total) by mouth daily.   atorvastatin (LIPITOR) 40 MG tablet Take 40 mg by mouth daily.   Cholecalciferol (VITAMIN D-3) 125 MCG (5000 UT) TABS Take 5,000 Units by mouth daily.   enalapril (VASOTEC) 20 MG tablet Take 20 mg by mouth daily.   ferrous sulfate 325 (65 FE) MG tablet Take 325 mg by mouth daily.   fluticasone (FLONASE) 50 MCG/ACT nasal spray Place 1-2 sprays into both nostrils daily as needed for allergies or rhinitis.   indapamide (LOZOL) 2.5 MG tablet Take 1.25 mg by mouth 2 (two) times daily.   metFORMIN (GLUCOPHAGE) 1000 MG tablet Take 1,000 mg by mouth 2 (two) times daily with a meal.   metoprolol tartrate (LOPRESSOR) 25 MG tablet Take 12.5 mg by mouth 2 (two) times daily.   nitroGLYCERIN (NITROSTAT) 0.4 MG SL tablet Place 0.4 mg under the tongue every 5 (five) minutes as needed for chest pain.   polyethylene glycol (MIRALAX / GLYCOLAX) 17 g packet Take 17 g by mouth daily as needed for mild constipation.   sertraline (ZOLOFT) 50 MG tablet Take 50 mg by mouth daily.   sitaGLIPtin (JANUVIA) 25 MG tablet Take 25 mg by mouth at bedtime.    vitamin B-12 (CYANOCOBALAMIN) 1000 MCG tablet Take 1,000 mcg by mouth daily.     Allergies:   Patient has no known allergies.    Social History   Socioeconomic History   Marital status: Married    Spouse name: Not on file   Number of children: Not on file   Years of education: Not on file   Highest education level: Not on file  Occupational History   Not on file  Tobacco Use   Smoking status: Never   Smokeless tobacco: Never  Vaping Use   Vaping Use: Never used  Substance and Sexual Activity   Alcohol use: Never   Drug use: Never   Sexual activity: Yes    Partners: Male  Other Topics Concern   Not on file  Social History Narrative   Not on file   Social Determinants of Health   Financial Resource Strain: Not on file  Food Insecurity: Not on file  Transportation Needs: Not on file  Physical Activity: Not on file  Stress: Not on file  Social Connections: Not on file  Family History: The patient's family history includes Heart attack in her brother, father, and mother.  ROS:   Please see the history of present illness.    All other systems reviewed and are negative.  EKGs/Labs/Other Studies Reviewed:    The following studies were reviewed today: I discussed my findings with the patient at length.   Recent Labs: 05/03/2021: ALT 11; BUN 17; Creatinine, Ser 0.99; Hemoglobin 10.8; Platelets 217; Potassium 4.3; Sodium 127; TSH 1.310  Recent Lipid Panel    Component Value Date/Time   CHOL 116 05/03/2021 0823   TRIG 113 05/03/2021 0823   HDL 39 (L) 05/03/2021 0823   CHOLHDL 3.0 05/03/2021 0823   LDLCALC 56 05/03/2021 0823    Physical Exam:    VS:  BP 130/70 (BP Location: Left Arm, Patient Position: Sitting)   Pulse 64   Ht 5\' 2"  (1.575 m)   Wt 150 lb 3.2 oz (68.1 kg)   SpO2 96%   BMI 27.47 kg/m     Wt Readings from Last 3 Encounters:  05/02/22 150 lb 3.2 oz (68.1 kg)  05/03/21 147 lb 9.6 oz (67 kg)  11/02/20 143 lb 3.2 oz (65 kg)     GEN: Patient is in no acute distress HEENT: Normal NECK: No JVD; No carotid bruits LYMPHATICS: No lymphadenopathy CARDIAC: Hear sounds  regular, 2/6 systolic murmur at the apex. RESPIRATORY:  Clear to auscultation without rales, wheezing or rhonchi  ABDOMEN: Soft, non-tender, non-distended MUSCULOSKELETAL:  No edema; No deformity  SKIN: Warm and dry NEUROLOGIC:  Alert and oriented x 3 PSYCHIATRIC:  Normal affect   Signed, 11/04/20, MD  05/02/2022 1:20 PM    [facility]

## 2022-05-02 NOTE — Patient Instructions (Signed)
Medication Instructions:  Your physician recommends that you continue on your current medications as directed. Please refer to the Current Medication list given to you today.  *If you need a refill on your cardiac medications before your next appointment, please call your pharmacy*   Lab Work: Your physician recommends that you return for lab work in: the next few days You need to have labs done when you are fasting.  You can come Monday through Friday 8:30 am to 12:00 pm and 1:15 to 4:30. You do not need to make an appointment as the order has already been placed. The labs you are going to have done are BMET,  LFT and Lipids.  If you have labs (blood work) drawn today and your tests are completely normal, you will receive your results only by: MyChart Message (if you have MyChart) OR A paper copy in the mail If you have any lab test that is abnormal or we need to change your treatment, we will call you to review the results.   Testing/Procedures: None ordered   Follow-Up: At [facility], you and your health needs are our priority.  As part of our continuing mission to provide you with exceptional heart care, we have created designated Provider Care Teams.  These Care Teams include your primary Cardiologist (physician) and Advanced Practice Providers (APPs -  Physician Assistants and Nurse Practitioners) who all work together to provide you with the care you need, when you need it.  We recommend signing up for the patient portal called "MyChart".  Sign up information is provided on this After Visit Summary.  MyChart is used to connect with patients for Virtual Visits (Telemedicine).  Patients are able to view lab/test results, encounter notes, upcoming appointments, etc.  Non-urgent messages can be sent to your provider as well.   To learn more about what you can do with MyChart, go to [email or web address].    Your next appointment:   12 month(s)  The format for your next  appointment:   In Person  Provider:   Rajan Revankar, MD   Other Instructions NA  

## 2022-05-05 DIAGNOSIS — E785 Hyperlipidemia, unspecified: Secondary | ICD-10-CM | POA: Diagnosis not present

## 2022-05-05 DIAGNOSIS — E1169 Type 2 diabetes mellitus with other specified complication: Secondary | ICD-10-CM | POA: Diagnosis not present

## 2022-05-05 DIAGNOSIS — E782 Mixed hyperlipidemia: Secondary | ICD-10-CM | POA: Diagnosis not present

## 2022-05-05 DIAGNOSIS — I2510 Atherosclerotic heart disease of native coronary artery without angina pectoris: Secondary | ICD-10-CM | POA: Diagnosis not present

## 2022-05-06 LAB — HEPATIC FUNCTION PANEL
ALT: 12 IU/L (ref 0–32)
AST: 13 IU/L (ref 0–40)
Albumin: 4.4 g/dL (ref 3.7–4.7)
Alkaline Phosphatase: 51 IU/L (ref 44–121)
Bilirubin Total: 0.2 mg/dL (ref 0.0–1.2)
Bilirubin, Direct: <0.1 mg/dL (ref 0.00–0.40)
Total Protein: 6.3 g/dL (ref 6.0–8.5)

## 2022-05-06 LAB — BASIC METABOLIC PANEL
BUN/Creatinine Ratio: 13 (ref 12–28)
BUN: 14 mg/dL (ref 8–27)
CO2: 24 mmol/L (ref 20–29)
Calcium: 10.4 mg/dL — ABNORMAL HIGH (ref 8.7–10.3)
Chloride: 90 mmol/L — ABNORMAL LOW (ref 96–106)
Creatinine, Ser: 1.06 mg/dL — ABNORMAL HIGH (ref 0.57–1.00)
Glucose: 108 mg/dL — ABNORMAL HIGH (ref 70–99)
Potassium: 4.1 mmol/L (ref 3.5–5.2)
Sodium: 126 mmol/L — ABNORMAL LOW (ref 134–144)
eGFR: 55 mL/min/{1.73_m2} — ABNORMAL LOW (ref >59)

## 2022-05-06 LAB — LIPID PANEL
Chol/HDL Ratio: 3 ratio (ref 0.0–4.4)
Cholesterol, Total: 121 mg/dL (ref 100–199)
HDL: 40 mg/dL (ref >39)
LDL Chol Calc (NIH): 60 mg/dL (ref 0–99)
Triglycerides: 118 mg/dL (ref 0–149)
VLDL Cholesterol Cal: 21 mg/dL (ref 5–40)

## 2022-05-07 ENCOUNTER — Telehealth: Payer: Self-pay

## 2022-05-07 NOTE — Telephone Encounter (Signed)
-----   Message from Rajan R Revankar, MD sent at 05/06/2022 10:52 AM EDT ----- The results of the study is unremarkable. Please inform patient. I will discuss in detail at next appointment. Cc  primary care/referring physician Rajan R Revankar, MD 05/06/2022 10:52 AM  

## 2022-05-07 NOTE — Telephone Encounter (Signed)
Patient notified of results. Results sent to PCP °

## 2022-08-04 DIAGNOSIS — K5730 Diverticulosis of large intestine without perforation or abscess without bleeding: Secondary | ICD-10-CM | POA: Diagnosis not present

## 2022-08-04 DIAGNOSIS — Z1211 Encounter for screening for malignant neoplasm of colon: Secondary | ICD-10-CM | POA: Diagnosis not present

## 2022-08-04 DIAGNOSIS — Z800 Family history of malignant neoplasm of digestive organs: Secondary | ICD-10-CM | POA: Diagnosis not present

## 2022-08-07 DIAGNOSIS — Z1331 Encounter for screening for depression: Secondary | ICD-10-CM | POA: Diagnosis not present

## 2022-08-07 DIAGNOSIS — E782 Mixed hyperlipidemia: Secondary | ICD-10-CM | POA: Diagnosis not present

## 2022-08-07 DIAGNOSIS — F324 Major depressive disorder, single episode, in partial remission: Secondary | ICD-10-CM | POA: Diagnosis not present

## 2022-08-07 DIAGNOSIS — I10 Essential (primary) hypertension: Secondary | ICD-10-CM | POA: Diagnosis not present

## 2022-08-07 DIAGNOSIS — Z79899 Other long term (current) drug therapy: Secondary | ICD-10-CM | POA: Diagnosis not present

## 2022-08-07 DIAGNOSIS — E1121 Type 2 diabetes mellitus with diabetic nephropathy: Secondary | ICD-10-CM | POA: Diagnosis not present

## 2022-08-07 DIAGNOSIS — I25119 Atherosclerotic heart disease of native coronary artery with unspecified angina pectoris: Secondary | ICD-10-CM | POA: Diagnosis not present

## 2022-08-07 DIAGNOSIS — Z6825 Body mass index (BMI) 25.0-25.9, adult: Secondary | ICD-10-CM | POA: Diagnosis not present

## 2022-08-07 DIAGNOSIS — Z0000 Encounter for general adult medical examination without abnormal findings: Secondary | ICD-10-CM | POA: Diagnosis not present

## 2022-09-16 DIAGNOSIS — N202 Calculus of kidney with calculus of ureter: Secondary | ICD-10-CM | POA: Diagnosis not present

## 2022-09-26 DIAGNOSIS — H2513 Age-related nuclear cataract, bilateral: Secondary | ICD-10-CM | POA: Diagnosis not present

## 2022-10-17 DIAGNOSIS — Z1231 Encounter for screening mammogram for malignant neoplasm of breast: Secondary | ICD-10-CM | POA: Diagnosis not present

## 2022-11-04 DIAGNOSIS — H25813 Combined forms of age-related cataract, bilateral: Secondary | ICD-10-CM | POA: Diagnosis not present

## 2022-11-04 DIAGNOSIS — H25811 Combined forms of age-related cataract, right eye: Secondary | ICD-10-CM | POA: Diagnosis not present

## 2022-11-04 DIAGNOSIS — H269 Unspecified cataract: Secondary | ICD-10-CM | POA: Diagnosis not present

## 2022-11-04 DIAGNOSIS — E119 Type 2 diabetes mellitus without complications: Secondary | ICD-10-CM | POA: Diagnosis not present

## 2022-11-04 DIAGNOSIS — H2511 Age-related nuclear cataract, right eye: Secondary | ICD-10-CM | POA: Diagnosis not present

## 2022-11-25 DIAGNOSIS — H25812 Combined forms of age-related cataract, left eye: Secondary | ICD-10-CM | POA: Diagnosis not present

## 2022-11-25 DIAGNOSIS — H269 Unspecified cataract: Secondary | ICD-10-CM | POA: Diagnosis not present

## 2022-11-25 DIAGNOSIS — H2512 Age-related nuclear cataract, left eye: Secondary | ICD-10-CM | POA: Diagnosis not present

## 2023-01-14 DIAGNOSIS — Z0100 Encounter for examination of eyes and vision without abnormal findings: Secondary | ICD-10-CM | POA: Diagnosis not present

## 2023-02-05 DIAGNOSIS — I25119 Atherosclerotic heart disease of native coronary artery with unspecified angina pectoris: Secondary | ICD-10-CM | POA: Diagnosis not present

## 2023-02-05 DIAGNOSIS — N1831 Chronic kidney disease, stage 3a: Secondary | ICD-10-CM | POA: Diagnosis not present

## 2023-02-05 DIAGNOSIS — Z6826 Body mass index (BMI) 26.0-26.9, adult: Secondary | ICD-10-CM | POA: Diagnosis not present

## 2023-02-05 DIAGNOSIS — E1121 Type 2 diabetes mellitus with diabetic nephropathy: Secondary | ICD-10-CM | POA: Diagnosis not present

## 2023-02-05 DIAGNOSIS — N200 Calculus of kidney: Secondary | ICD-10-CM | POA: Diagnosis not present

## 2023-02-05 DIAGNOSIS — F324 Major depressive disorder, single episode, in partial remission: Secondary | ICD-10-CM | POA: Diagnosis not present

## 2023-05-05 ENCOUNTER — Ambulatory Visit: Payer: Medicare HMO | Attending: Cardiology | Admitting: Cardiology

## 2023-05-05 ENCOUNTER — Encounter: Payer: Self-pay | Admitting: Cardiology

## 2023-05-05 VITALS — BP 118/60 | HR 74 | Ht 63.0 in | Wt 151.2 lb

## 2023-05-05 DIAGNOSIS — E1169 Type 2 diabetes mellitus with other specified complication: Secondary | ICD-10-CM

## 2023-05-05 DIAGNOSIS — I10 Essential (primary) hypertension: Secondary | ICD-10-CM

## 2023-05-05 DIAGNOSIS — I2510 Atherosclerotic heart disease of native coronary artery without angina pectoris: Secondary | ICD-10-CM

## 2023-05-05 DIAGNOSIS — Z7984 Long term (current) use of oral hypoglycemic drugs: Secondary | ICD-10-CM

## 2023-05-05 DIAGNOSIS — E782 Mixed hyperlipidemia: Secondary | ICD-10-CM

## 2023-05-05 DIAGNOSIS — E785 Hyperlipidemia, unspecified: Secondary | ICD-10-CM

## 2023-05-05 MED ORDER — NITROGLYCERIN 0.4 MG SL SUBL: 0.4000 mg | SUBLINGUAL_TABLET | SUBLINGUAL | 6 refills | Status: AC | PRN

## 2023-05-05 NOTE — Progress Notes (Signed)
Cardiology Office Note:    Date:  05/05/2023   ID:  Claudia Christian, DOB 02-24-1946, MRN [id number]  PCP:  Buckner Malta, MD  Cardiologist:  Garwin Brothers, MD   Referring MD: Buckner Malta, MD    ASSESSMENT:    1. Coronary artery disease involving native coronary artery of native heart without angina pectoris   2. Essential hypertension   3. Type 2 diabetes mellitus with hyperlipidemia (HCC)   4. Mixed hyperlipidemia    PLAN:    In order of problems listed above:  Coronary artery disease: Secondary prevention stressed with the patient.  Importance of compliance with diet medication stressed and she vocalized understanding.  She was advised to walk at least half an hour a day 5 days a week and she promises to do so.  Coronary angiography report was discussed with the patient at length Essential hypertension: Blood pressure stable and diet was emphasized.  Lifestyle modification urged. Mixed dyslipidemia: On lipid-lowering medications and she is fasting for complete blood work. Recent blood work was reviewed including A1c from primary care.  Discussed. Patient will be seen in follow-up appointment in 6 months or earlier if the patient has any concerns.    Medication Adjustments/Labs and Tests Ordered: Current medicines are reviewed at length with the patient today.  Concerns regarding medicines are outlined above.  No orders of the defined types were placed in this encounter.  No orders of the defined types were placed in this encounter.    No chief complaint on file.    History of Present Illness:    Claudia Christian is a 77 y.o. female.  Patient has past medical history of coronary artery disease, essential hypertension, mixed dyslipidemia and diabetes mellitus.  She denies any problems at this time and takes care of activities of daily living.  She tells me that she walks about 20 minutes a day without any problems.  She requests a refill for nitroglycerin.  At  the time of my evaluation, the patient is alert awake oriented and in no distress.  Past Medical History:  Diagnosis Date   Anemia 11/28/2019   CAD (coronary artery disease) 11/28/2019   Coronary arteriosclerosis 11/28/2019   Coronary artery disease    Diabetes mellitus type 2, controlled (HCC) 2016   Essential hypertension    Hematuria 06/19/2020   History of kidney stones    History of non-ST elevation myocardial infarction (NSTEMI) 11/15/2019   Hydronephrosis with renal and ureteral calculus obstruction 11/15/2019   Hyperlipidemia    Hyponatremia 11/15/2019   Myocardial infarction (HCC) 11/2019   Nephrolithiasis    Sepsis (HCC) 11/15/2019   Staghorn calculus 06/13/2020   Type 2 diabetes mellitus with hyperlipidemia (HCC) 11/15/2019    Past Surgical History:  Procedure Laterality Date   CARDIAC CATHETERIZATION     CORONARY BALLOON ANGIOPLASTY N/A 12/02/2019   Procedure: CORONARY BALLOON ANGIOPLASTY;  Surgeon: Corky Crafts, MD;  Location: [facility] INVASIVE CV LAB;  Service: Cardiovascular;  Laterality: N/A;   CORONARY STENT INTERVENTION N/A 12/02/2019   Procedure: CORONARY STENT INTERVENTION;  Surgeon: Corky Crafts, MD;  Location: [facility] INVASIVE CV LAB;  Service: Cardiovascular;  Laterality: N/A;   CORONARY ULTRASOUND/IVUS N/A 12/02/2019   Procedure: Intravascular Ultrasound/IVUS;  Surgeon: Corky Crafts, MD;  Location: [facility] INVASIVE CV LAB;  Service: Cardiovascular;  Laterality: N/A;   IR NEPHROSTOMY EXCHANGE LEFT  12/28/2019   IR NEPHROSTOMY EXCHANGE LEFT  02/09/2020   IR NEPHROSTOMY EXCHANGE LEFT  03/22/2020   IR NEPHROSTOMY EXCHANGE  LEFT  05/03/2020   IR NEPHROSTOMY PLACEMENT LEFT  11/16/2019   LEFT HEART CATH AND CORONARY ANGIOGRAPHY N/A 12/02/2019   Procedure: LEFT HEART CATH AND CORONARY ANGIOGRAPHY;  Surgeon: Corky Crafts, MD;  Location: [facility] INVASIVE CV LAB;  Service: Cardiovascular;  Laterality: N/A;   LITHOTRIPSY     NEPHROLITHOTOMY Left 06/13/2020   Procedure:  NEPHROLITHOTOMY PERCUTANEOUS;  Surgeon: Sebastian Ache, MD;  Location: [facility] ORS;  Service: Urology;  Laterality: Left;  3 HRS   TUBAL LIGATION  1984   URETEROSCOPY Left 06/13/2020   Procedure: URETEROSCOPY WITH BASKETING AND STENT PLACEMENT;  Surgeon: Sebastian Ache, MD;  Location: [facility] ORS;  Service: Urology;  Laterality: Left;    Current Medications: Current Meds  Medication Sig   acetaminophen (TYLENOL) 325 MG tablet Take 2 tablets (650 mg total) by mouth every 6 (six) hours as needed for mild pain (or Fever >/= 101).   alendronate (FOSAMAX) 70 MG tablet Take 70 mg by mouth once a week. Take with a full glass of water on an empty stomach.   aspirin EC 81 MG EC tablet Take 1 tablet (81 mg total) by mouth daily.   atorvastatin (LIPITOR) 40 MG tablet Take 40 mg by mouth daily.   Cholecalciferol (VITAMIN D-3) 125 MCG (5000 UT) TABS Take 5,000 Units by mouth daily.   enalapril (VASOTEC) 20 MG tablet Take 20 mg by mouth daily.   ferrous sulfate 325 (65 FE) MG tablet Take 325 mg by mouth daily.   fluticasone (FLONASE) 50 MCG/ACT nasal spray Place 1-2 sprays into both nostrils daily as needed for allergies or rhinitis.   indapamide (LOZOL) 2.5 MG tablet Take 1.25 mg by mouth daily.   metFORMIN (GLUCOPHAGE) 1000 MG tablet Take 1,000 mg by mouth 2 (two) times daily with a meal.   nitroGLYCERIN (NITROSTAT) 0.4 MG SL tablet Place 0.4 mg under the tongue every 5 (five) minutes as needed for chest pain.   polyethylene glycol (MIRALAX / GLYCOLAX) 17 g packet Take 17 g by mouth daily as needed for mild constipation.   sertraline (ZOLOFT) 50 MG tablet Take 50 mg by mouth daily.   sitaGLIPtin (JANUVIA) 25 MG tablet Take 25 mg by mouth at bedtime.    vitamin B-12 (CYANOCOBALAMIN) 1000 MCG tablet Take 1,000 mcg by mouth daily.     Allergies:   Patient has no known allergies.   Social History   Socioeconomic History   Marital status: Married    Spouse name: Not on file   Number of children: Not on file    Years of education: Not on file   Highest education level: Not on file  Occupational History   Not on file  Tobacco Use   Smoking status: Never   Smokeless tobacco: Never  Vaping Use   Vaping Use: Never used  Substance and Sexual Activity   Alcohol use: Never   Drug use: Never   Sexual activity: Yes    Partners: Male  Other Topics Concern   Not on file  Social History Narrative   Not on file   Social Determinants of Health   Financial Resource Strain: Not on file  Food Insecurity: Not on file  Transportation Needs: Not on file  Physical Activity: Not on file  Stress: Not on file  Social Connections: Not on file     Family History: The patient's family history includes Heart attack in her brother, father, and mother.  ROS:   Please see the history of present illness.  All other systems reviewed and are negative.  EKGs/Labs/Other Studies Reviewed:    The following studies were reviewed today: EKG was within normal limits   Recent Labs: 05/05/2022: ALT 12; BUN 14; Creatinine, Ser 1.06; Potassium 4.1; Sodium 126  Recent Lipid Panel    Component Value Date/Time   CHOL 121 05/05/2022 0858   TRIG 118 05/05/2022 0858   HDL 40 05/05/2022 0858   CHOLHDL 3.0 05/05/2022 0858   LDLCALC 60 05/05/2022 0858    Physical Exam:    VS:  BP 118/60   Pulse 74   Ht 5\' 3"  (1.6 m)   Wt 151 lb 3.2 oz (68.6 kg)   SpO2 96%   BMI 26.78 kg/m     Wt Readings from Last 3 Encounters:  05/05/23 151 lb 3.2 oz (68.6 kg)  05/02/22 150 lb 3.2 oz (68.1 kg)  05/03/21 147 lb 9.6 oz (67 kg)     GEN: Patient is in no acute distress HEENT: Normal NECK: No JVD; No carotid bruits LYMPHATICS: No lymphadenopathy CARDIAC: Hear sounds regular, 2/6 systolic murmur at the apex. RESPIRATORY:  Clear to auscultation without rales, wheezing or rhonchi  ABDOMEN: Soft, non-tender, non-distended MUSCULOSKELETAL:  No edema; No deformity  SKIN: Warm and dry NEUROLOGIC:  Alert and oriented x  3 PSYCHIATRIC:  Normal affect   Signed, Garwin Brothers, MD  05/05/2023 8:54 AM    [facility]

## 2023-05-05 NOTE — Patient Instructions (Signed)
Medication Instructions:  Your physician recommends that you continue on your current medications as directed. Please refer to the Current Medication list given to you today.  *If you need a refill on your cardiac medications before your next appointment, please call your pharmacy*   Lab Work: Your physician recommends that you have a CMP, CBC,TSH and lipids done today in the office. If you have labs (blood work) drawn today and your tests are completely normal, you will receive your results only by: MyChart Message (if you have MyChart) OR A paper copy in the mail If you have any lab test that is abnormal or we need to change your treatment, we will call you to review the results.   Testing/Procedures: Your physician has requested that you have an echocardiogram. Echocardiography is a painless test that uses sound waves to create images of your heart. It provides your doctor with information about the size and shape of your heart and how well your heart's chambers and valves are working. This procedure takes approximately one hour. There are no restrictions for this procedure. Please do NOT wear cologne, perfume, aftershave, or lotions (deodorant is allowed). Please arrive 15 minutes prior to your appointment time.   Follow-Up: At [facility], you and your health needs are our priority.  As part of our continuing mission to provide you with exceptional heart care, we have created designated Provider Care Teams.  These Care Teams include your primary Cardiologist (physician) and Advanced Practice Providers (APPs -  Physician Assistants and Nurse Practitioners) who all work together to provide you with the care you need, when you need it.  We recommend signing up for the patient portal called "MyChart".  Sign up information is provided on this After Visit Summary.  MyChart is used to connect with patients for Virtual Visits (Telemedicine).  Patients are able to view lab/test results,  encounter notes, upcoming appointments, etc.  Non-urgent messages can be sent to your provider as well.   To learn more about what you can do with MyChart, go to [email or web address].    Your next appointment:   9 month(s)  The format for your next appointment:   In Person  Provider:   Belva Crome, MD   Other Instructions Echocardiogram An echocardiogram is a test that uses sound waves (ultrasound) to produce images of the heart. Images from an echocardiogram can provide important information about: Heart size and shape. The size and thickness and movement of your heart's walls. Heart muscle function and strength. Heart valve function or if you have stenosis. Stenosis is when the heart valves are too narrow. If blood is flowing backward through the heart valves (regurgitation). A tumor or infectious growth around the heart valves. Areas of heart muscle that are not working well because of poor blood flow or injury from a heart attack. Aneurysm detection. An aneurysm is a weak or damaged part of an artery wall. The wall bulges out from the normal force of blood pumping through the body. Tell a health care provider about: Any allergies you have. All medicines you are taking, including vitamins, herbs, eye drops, creams, and over-the-counter medicines. Any blood disorders you have. Any surgeries you have had. Any medical conditions you have. Whether you are pregnant or may be pregnant. What are the risks? Generally, this is a safe test. However, problems may occur, including an allergic reaction to dye (contrast) that may be used during the test. What happens before the test? No specific preparation is needed. You  may eat and drink normally. What happens during the test? You will take off your clothes from the waist up and put on a hospital gown. Electrodes or electrocardiogram (ECG)patches may be placed on your chest. The electrodes or patches are then connected to a device  that monitors your heart rate and rhythm. You will lie down on a table for an ultrasound exam. A gel will be applied to your chest to help sound waves pass through your skin. A handheld device, called a transducer, will be pressed against your chest and moved over your heart. The transducer produces sound waves that travel to your heart and bounce back (or "echo" back) to the transducer. These sound waves will be captured in real-time and changed into images of your heart that can be viewed on a video monitor. The images will be recorded on a computer and reviewed by your health care provider. You may be asked to change positions or hold your breath for a short time. This makes it easier to get different views or better views of your heart. In some cases, you may receive contrast through an IV in one of your veins. This can improve the quality of the pictures from your heart. The procedure may vary among health care providers and hospitals.   What can I expect after the test? You may return to your normal, everyday life, including diet, activities, and medicines, unless your health care provider tells you not to do that. Follow these instructions at home: It is up to you to get the results of your test. Ask your health care provider, or the department that is doing the test, when your results will be ready. Keep all follow-up visits. This is important. Summary An echocardiogram is a test that uses sound waves (ultrasound) to produce images of the heart. Images from an echocardiogram can provide important information about the size and shape of your heart, heart muscle function, heart valve function, and other possible heart problems. You do not need to do anything to prepare before this test. You may eat and drink normally. After the echocardiogram is completed, you may return to your normal, everyday life, unless your health care provider tells you not to do that. This information is not intended to  replace advice given to you by your health care provider. Make sure you discuss any questions you have with your health care provider. Document Revised: 06/26/2020 Document Reviewed: 06/26/2020 Elsevier Patient Education  2021 Elsevier Inc.   Important Information About Sugar

## 2023-05-05 NOTE — Addendum Note (Signed)
Addended by: Eleonore Chiquito on: 05/05/2023 09:13 AM   Modules accepted: Orders

## 2023-05-06 ENCOUNTER — Telehealth: Payer: Self-pay

## 2023-05-06 LAB — COMPREHENSIVE METABOLIC PANEL
ALT: 13 IU/L (ref 0–32)
AST: 13 IU/L (ref 0–40)
Albumin: 4.3 g/dL (ref 3.8–4.8)
Alkaline Phosphatase: 56 IU/L (ref 44–121)
BUN/Creatinine Ratio: 19 (ref 12–28)
BUN: 21 mg/dL (ref 8–27)
Bilirubin Total: 0.3 mg/dL (ref 0.0–1.2)
CO2: 26 mmol/L (ref 20–29)
Calcium: 10.5 mg/dL — ABNORMAL HIGH (ref 8.7–10.3)
Chloride: 92 mmol/L — ABNORMAL LOW (ref 96–106)
Creatinine, Ser: 1.13 mg/dL — ABNORMAL HIGH (ref 0.57–1.00)
Globulin, Total: 2.4 g/dL (ref 1.5–4.5)
Glucose: 128 mg/dL — ABNORMAL HIGH (ref 70–99)
Potassium: 4.7 mmol/L (ref 3.5–5.2)
Sodium: 132 mmol/L — ABNORMAL LOW (ref 134–144)
Total Protein: 6.7 g/dL (ref 6.0–8.5)
eGFR: 51 mL/min/{1.73_m2} — ABNORMAL LOW (ref >59)

## 2023-05-06 LAB — CBC
Hematocrit: 33.1 % — ABNORMAL LOW (ref 34.0–46.6)
Hemoglobin: 10.7 g/dL — ABNORMAL LOW (ref 11.1–15.9)
MCH: 28.5 pg (ref 26.6–33.0)
MCHC: 32.3 g/dL (ref 31.5–35.7)
MCV: 88 fL (ref 79–97)
Platelets: 262 10*3/uL (ref 150–450)
RBC: 3.76 x10E6/uL — ABNORMAL LOW (ref 3.77–5.28)
RDW: 12.6 % (ref 11.7–15.4)
WBC: 7.1 10*3/uL (ref 3.4–10.8)

## 2023-05-06 LAB — LIPID PANEL
Chol/HDL Ratio: 2.9 ratio (ref 0.0–4.4)
Cholesterol, Total: 109 mg/dL (ref 100–199)
HDL: 38 mg/dL — ABNORMAL LOW (ref >39)
LDL Chol Calc (NIH): 51 mg/dL (ref 0–99)
Triglycerides: 110 mg/dL (ref 0–149)
VLDL Cholesterol Cal: 20 mg/dL (ref 5–40)

## 2023-05-06 LAB — TSH: TSH: 1.13 u[IU]/mL (ref 0.450–4.500)

## 2023-05-06 NOTE — Telephone Encounter (Signed)
Patient notified through my chart. Results sent to PCP  

## 2023-05-06 NOTE — Telephone Encounter (Signed)
-----   Message from Rajan R Revankar, MD sent at 05/06/2023  3:27 PM EDT ----- The results of the study is unremarkable. Please inform patient. I will discuss in detail at next appointment. Cc  primary care/referring physician Rajan R Revankar, MD 05/06/2023 3:27 PM  

## 2023-05-12 ENCOUNTER — Ambulatory Visit: Payer: Medicare HMO | Attending: Cardiology

## 2023-05-12 DIAGNOSIS — I2510 Atherosclerotic heart disease of native coronary artery without angina pectoris: Secondary | ICD-10-CM

## 2023-05-12 LAB — ECHOCARDIOGRAM COMPLETE: S' Lateral: 2.2 cm

## 2023-05-12 MED ORDER — PERFLUTREN LIPID MICROSPHERE
1.0000 mL | INTRAVENOUS | Status: AC | PRN
Start: 2023-05-12 — End: 2023-05-12
  Administered 2023-05-12: 5 mL via INTRAVENOUS

## 2023-07-07 DIAGNOSIS — H2513 Age-related nuclear cataract, bilateral: Secondary | ICD-10-CM | POA: Diagnosis not present

## 2023-08-26 DIAGNOSIS — E785 Hyperlipidemia, unspecified: Secondary | ICD-10-CM | POA: Diagnosis not present

## 2023-08-26 DIAGNOSIS — E1159 Type 2 diabetes mellitus with other circulatory complications: Secondary | ICD-10-CM | POA: Diagnosis not present

## 2023-08-26 DIAGNOSIS — Z780 Asymptomatic menopausal state: Secondary | ICD-10-CM | POA: Diagnosis not present

## 2023-08-26 DIAGNOSIS — Z23 Encounter for immunization: Secondary | ICD-10-CM | POA: Diagnosis not present

## 2023-08-26 DIAGNOSIS — E1169 Type 2 diabetes mellitus with other specified complication: Secondary | ICD-10-CM | POA: Diagnosis not present

## 2023-08-26 DIAGNOSIS — Z0000 Encounter for general adult medical examination without abnormal findings: Secondary | ICD-10-CM | POA: Diagnosis not present

## 2023-08-26 DIAGNOSIS — E1121 Type 2 diabetes mellitus with diabetic nephropathy: Secondary | ICD-10-CM | POA: Diagnosis not present

## 2023-08-26 DIAGNOSIS — I25119 Atherosclerotic heart disease of native coronary artery with unspecified angina pectoris: Secondary | ICD-10-CM | POA: Diagnosis not present

## 2023-08-26 DIAGNOSIS — F324 Major depressive disorder, single episode, in partial remission: Secondary | ICD-10-CM | POA: Diagnosis not present

## 2023-09-15 DIAGNOSIS — N3000 Acute cystitis without hematuria: Secondary | ICD-10-CM | POA: Diagnosis not present

## 2023-09-15 DIAGNOSIS — N202 Calculus of kidney with calculus of ureter: Secondary | ICD-10-CM | POA: Diagnosis not present

## 2023-11-26 DIAGNOSIS — E1159 Type 2 diabetes mellitus with other circulatory complications: Secondary | ICD-10-CM | POA: Diagnosis not present

## 2023-11-26 DIAGNOSIS — I152 Hypertension secondary to endocrine disorders: Secondary | ICD-10-CM | POA: Diagnosis not present

## 2023-11-26 DIAGNOSIS — E785 Hyperlipidemia, unspecified: Secondary | ICD-10-CM | POA: Diagnosis not present

## 2023-11-26 DIAGNOSIS — F324 Major depressive disorder, single episode, in partial remission: Secondary | ICD-10-CM | POA: Diagnosis not present

## 2023-11-26 DIAGNOSIS — N1831 Chronic kidney disease, stage 3a: Secondary | ICD-10-CM | POA: Diagnosis not present

## 2023-11-26 DIAGNOSIS — E1169 Type 2 diabetes mellitus with other specified complication: Secondary | ICD-10-CM | POA: Diagnosis not present

## 2023-11-26 DIAGNOSIS — I25119 Atherosclerotic heart disease of native coronary artery with unspecified angina pectoris: Secondary | ICD-10-CM | POA: Diagnosis not present

## 2023-11-26 DIAGNOSIS — E1121 Type 2 diabetes mellitus with diabetic nephropathy: Secondary | ICD-10-CM | POA: Diagnosis not present

## 2023-11-26 DIAGNOSIS — Z79899 Other long term (current) drug therapy: Secondary | ICD-10-CM | POA: Diagnosis not present

## 2023-11-26 LAB — LAB REPORT - SCANNED
A1c: 6.8
Albumin, Urine POC: 36.8
Creatinine, POC: 23.2 mg/dL
EGFR: 56

## 2023-12-24 DIAGNOSIS — N3000 Acute cystitis without hematuria: Secondary | ICD-10-CM | POA: Diagnosis not present

## 2023-12-24 DIAGNOSIS — R300 Dysuria: Secondary | ICD-10-CM | POA: Diagnosis not present

## 2023-12-24 DIAGNOSIS — R350 Frequency of micturition: Secondary | ICD-10-CM | POA: Diagnosis not present

## 2023-12-25 DIAGNOSIS — N3000 Acute cystitis without hematuria: Secondary | ICD-10-CM | POA: Diagnosis not present

## 2024-01-21 DIAGNOSIS — R300 Dysuria: Secondary | ICD-10-CM | POA: Diagnosis not present

## 2024-01-22 ENCOUNTER — Encounter: Payer: Self-pay | Admitting: Cardiology

## 2024-01-22 ENCOUNTER — Ambulatory Visit: Payer: Medicare HMO | Attending: Cardiology | Admitting: Cardiology

## 2024-01-22 ENCOUNTER — Telehealth: Payer: Self-pay | Admitting: Pharmacist

## 2024-01-22 VITALS — BP 140/70 | HR 86 | Ht 65.0 in | Wt 150.0 lb

## 2024-01-22 DIAGNOSIS — I10 Essential (primary) hypertension: Secondary | ICD-10-CM

## 2024-01-22 DIAGNOSIS — E785 Hyperlipidemia, unspecified: Secondary | ICD-10-CM | POA: Diagnosis not present

## 2024-01-22 DIAGNOSIS — E781 Pure hyperglyceridemia: Secondary | ICD-10-CM

## 2024-01-22 DIAGNOSIS — E1169 Type 2 diabetes mellitus with other specified complication: Secondary | ICD-10-CM

## 2024-01-22 DIAGNOSIS — I2510 Atherosclerotic heart disease of native coronary artery without angina pectoris: Secondary | ICD-10-CM

## 2024-01-22 NOTE — Progress Notes (Signed)
 Cardiology Office Note:    Date:  01/22/2024   ID:  Claudia Christian, DOB 1946-08-11, MRN [id number]  PCP:  Buckner Malta, MD  Cardiologist:  Garwin Brothers, MD   Referring MD: Buckner Malta, MD    ASSESSMENT:    1. Coronary arteriosclerosis   2. Coronary artery disease involving native coronary artery of native heart without angina pectoris   3. Essential hypertension   4. Type 2 diabetes mellitus with hyperlipidemia (HCC)   5. Pure hypertriglyceridemia    PLAN:    In order of problems listed above:  Coronary artery disease: Secondary prevention stressed with the patient.  Importance of compliance with diet medication stressed and show vocalized understanding.  I emphasized to her the importance of regular exercise and she promises to do better.  She will walk at least half an hour a day 5 days a week Essential hypertension: Blood pressure is stable and diet was emphasized.  Salt intake issues diet and lifestyle modification urged. Mixed dyslipidemia: On lipid-lowering medications.  Followed by primary care.  Diet emphasized. Diabetes mellitus: Elevated hemoglobin A1c and diet counseled about this.  This is followed by primary care.  Lifestyle modification urged. Patient will be seen in follow-up appointment in 6 months or earlier if the patient has any concerns.    Medication Adjustments/Labs and Tests Ordered: Current medicines are reviewed at length with the patient today.  Concerns regarding medicines are outlined above.  No orders of the defined types were placed in this encounter.  No orders of the defined types were placed in this encounter.    No chief complaint on file.    History of Present Illness:    Claudia Christian is a 78 y.o. female.  Patient has past medical history of coronary artery disease, essential hypertension, mixed dyslipidemia and diabetes mellitus.  She denies any problems at this time and takes care of activities of daily living.  She  walks on a regular basis at least 30 minutes and very happy about it.  She is asymptomatic.  She is here for routine follow-up.  At the time of my evaluation, the patient is alert awake oriented and in no distress.  Past Medical History:  Diagnosis Date   Anemia 11/28/2019   CAD (coronary artery disease) 11/28/2019   Coronary arteriosclerosis 11/28/2019   Coronary artery disease    Diabetes mellitus type 2, controlled (HCC) 2016   Essential hypertension    Hematuria 06/19/2020   History of kidney stones    History of non-ST elevation myocardial infarction (NSTEMI) 11/15/2019   Hydronephrosis with renal and ureteral calculus obstruction 11/15/2019   Hyperlipidemia    Hyponatremia 11/15/2019   Myocardial infarction (HCC) 11/2019   Nephrolithiasis    Sepsis (HCC) 11/15/2019   Staghorn calculus 06/13/2020   Type 2 diabetes mellitus with hyperlipidemia (HCC) 11/15/2019    Past Surgical History:  Procedure Laterality Date   CARDIAC CATHETERIZATION     CORONARY BALLOON ANGIOPLASTY N/A 12/02/2019   Procedure: CORONARY BALLOON ANGIOPLASTY;  Surgeon: Corky Crafts, MD;  Location: [facility] INVASIVE CV LAB;  Service: Cardiovascular;  Laterality: N/A;   CORONARY STENT INTERVENTION N/A 12/02/2019   Procedure: CORONARY STENT INTERVENTION;  Surgeon: Corky Crafts, MD;  Location[facility] INVASIVE CV LAB;  Service: Cardiovascular;  Laterality: N/A;   CORONARY ULTRASOUND/IVUS N/A 12/02/2019   Procedure: Intravascular Ultrasound/IVUS;  Surgeon: Corky Crafts, MD;  Location: [facility] INVASIVE CV LAB;  Service: Cardiovascular;  Laterality: N/A;   IR NEPHROSTOMY EXCHANGE LEFT  12/28/2019   IR NEPHROSTOMY EXCHANGE LEFT  02/09/2020   IR NEPHROSTOMY EXCHANGE LEFT  03/22/2020   IR NEPHROSTOMY EXCHANGE LEFT  05/03/2020   IR NEPHROSTOMY PLACEMENT LEFT  11/16/2019   LEFT HEART CATH AND CORONARY ANGIOGRAPHY N/A 12/02/2019   Procedure: LEFT HEART CATH AND CORONARY ANGIOGRAPHY;  Surgeon: Corky Crafts, MD;   Location: [facility] INVASIVE CV LAB;  Service: Cardiovascular;  Laterality: N/A;   LITHOTRIPSY     NEPHROLITHOTOMY Left 06/13/2020   Procedure: NEPHROLITHOTOMY PERCUTANEOUS;  Surgeon: Sebastian Ache, MD;  Location: [facility];  Service: Urology;  Laterality: Left;  3 HRS   TUBAL LIGATION  1984   URETEROSCOPY Left 06/13/2020   Procedure: URETEROSCOPY WITH BASKETING AND STENT PLACEMENT;  Surgeon: Sebastian Ache, MD;  Location: [facility];  Service: Urology;  Laterality: Left;    Current Medications: Current Meds  Medication Sig   acetaminophen (TYLENOL) 325 MG tablet Take 2 tablets (650 mg total) by mouth every 6 (six) hours as needed for mild pain (or Fever >/= 101).   alendronate (FOSAMAX) 70 MG tablet Take 70 mg by mouth once a week. Take with a full glass of water on an empty stomach.   aspirin EC 81 MG EC tablet Take 1 tablet (81 mg total) by mouth daily.   atorvastatin (LIPITOR) 40 MG tablet Take 40 mg by mouth daily.   Cholecalciferol (VITAMIN D-3) 125 MCG (5000 UT) TABS Take 5,000 Units by mouth daily.   enalapril (VASOTEC) 20 MG tablet Take 20 mg by mouth daily.   ferrous sulfate 325 (65 FE) MG tablet Take 325 mg by mouth daily.   fluticasone (FLONASE) 50 MCG/ACT nasal spray Place 1-2 sprays into both nostrils daily as needed for allergies or rhinitis.   indapamide (LOZOL) 2.5 MG tablet Take 1.25 mg by mouth daily.   metFORMIN (GLUCOPHAGE) 1000 MG tablet Take 1,000 mg by mouth 2 (two) times daily with a meal.   nitroGLYCERIN (NITROSTAT) 0.4 MG SL tablet Place 1 tablet (0.4 mg total) under the tongue every 5 (five) minutes as needed for chest pain.   polyethylene glycol (MIRALAX / GLYCOLAX) 17 g packet Take 17 g by mouth daily as needed for mild constipation.   sertraline (ZOLOFT) 50 MG tablet Take 50 mg by mouth daily.   sitaGLIPtin (JANUVIA) 25 MG tablet Take 25 mg by mouth at bedtime.    vitamin B-12 (CYANOCOBALAMIN) 1000 MCG tablet Take 1,000 mcg by mouth daily.     Allergies:   Patient has  no known allergies.   Social History   Socioeconomic History   Marital status: Married    Spouse name: Not on file   Number of children: Not on file   Years of education: Not on file   Highest education level: Not on file  Occupational History   Not on file  Tobacco Use   Smoking status: Never   Smokeless tobacco: Never  Vaping Use   Vaping status: Never Used  Substance and Sexual Activity   Alcohol use: Never   Drug use: Never   Sexual activity: Yes    Partners: Male  Other Topics Concern   Not on file  Social History Narrative   Not on file   Social Drivers of Health   Financial Resource Strain: Not on file  Food Insecurity: Not on file  Transportation Needs: Not on file  Physical Activity: Not on file  Stress: Not on file  Social Connections: Not on file     Family History: The  patient's family history includes Heart attack in her brother, father, and mother.  ROS:   Please see the history of present illness.    All other systems reviewed and are negative.  EKGs/Labs/Other Studies Reviewed:    The following studies were reviewed today: I discussed my diagnosis with the patient at length   Recent Labs: 05/05/2023: ALT 13; BUN 21; Creatinine, Ser 1.13; Hemoglobin 10.7; Platelets 262; Potassium 4.7; Sodium 132; TSH 1.130  Recent Lipid Panel    Component Value Date/Time   CHOL 109 05/05/2023 0925   TRIG 110 05/05/2023 0925   HDL 38 (L) 05/05/2023 0925   CHOLHDL 2.9 05/05/2023 0925   LDLCALC 51 05/05/2023 0925    Physical Exam:    VS:  BP (!) 140/70   Pulse 86   Ht 5\' 5"  (1.651 m)   Wt 150 lb (68 kg)   SpO2 96%   BMI 24.96 kg/m     Wt Readings from Last 3 Encounters:  01/22/24 150 lb (68 kg)  05/05/23 151 lb 3.2 oz (68.6 kg)  05/02/22 150 lb 3.2 oz (68.1 kg)     GEN: Patient is in no acute distress HEENT: Normal NECK: No JVD; No carotid bruits LYMPHATICS: No lymphadenopathy CARDIAC: Hear sounds regular, 2/6 systolic murmur at the  apex. RESPIRATORY:  Clear to auscultation without rales, wheezing or rhonchi  ABDOMEN: Soft, non-tender, non-distended MUSCULOSKELETAL:  No edema; No deformity  SKIN: Warm and dry NEUROLOGIC:  Alert and oriented x 3 PSYCHIATRIC:  Normal affect   Signed, Garwin Brothers, MD  01/22/2024 11:17 AM    [facility]

## 2024-01-22 NOTE — Patient Instructions (Signed)

## 2024-01-22 NOTE — Progress Notes (Signed)
   01/22/2024  Patient ID: Claudia Christian, female   DOB: 1946-06-22, 78 y.o.   MRN: [id number]  Patient brought application for Januvia 25 mg 1 tablet PO daily from [facility] into office. Application has been faxed to [facility] patient assistance company by PCP office. Will ask [facility] patient advocate to follow up on status and outcome of application.   Reynold Bowen, PharmD Clinical Pharmacist [place] Direct Dial: [phone]

## 2024-02-09 ENCOUNTER — Telehealth: Payer: Self-pay

## 2024-02-09 NOTE — Telephone Encounter (Signed)
 Received patient application from office, provider did not sign provider forms.  Refaxed provider form to Dr. Buckner Malta for review and signature

## 2024-02-11 NOTE — Telephone Encounter (Signed)
 PAP: Application for Januvia has been submitted to [employer], via fax

## 2024-03-01 ENCOUNTER — Telehealth: Payer: Self-pay | Admitting: Pharmacist

## 2024-03-01 NOTE — Progress Notes (Signed)
 Attempted to contact patient for medication access. Left HIPAA compliant message for patient to return my call at their convenience.   Reynold Bowen, PharmD Clinical Pharmacist [place] Direct Dial: [phone]

## 2024-03-02 ENCOUNTER — Telehealth: Payer: Self-pay | Admitting: Pharmacist

## 2024-03-02 NOTE — Progress Notes (Signed)
   03/02/2024  Patient ID: Claudia Christian, female   DOB: 06-27-1946, 78 y.o.   MRN: [id number]  Telephonic engagement with Mrs. Claudia Christian today. Relayed follow up information to patient regarding attestation form from [id number] patient assistance program.Patient did not retain attestation form mailed by [facility] on 02/12/2024. Placed call to [facility] and requested re mailing of attestation form. Patient will be on the lookout for the form to arrive at home address within 7 to 10 business days.   Calvert Caul, PharmD Clinical Pharmacist [place] Direct Dial: [phone]

## 2024-03-14 NOTE — Telephone Encounter (Signed)
 Attestation form mailed on 4/16

## 2024-03-21 NOTE — Progress Notes (Signed)
 Pharmacy Medication Assistance Program Note    03/21/2024  Patient ID: Claudia Christian, female   DOB: 1946-04-19, 78 y.o.   MRN: [id number]     02/09/2024  Outreach Medication One  Initial Outreach Date (Medication One) 02/09/2024  Manufacturer Medication One [employer]  [employer] Januvia  Dose of Januvia 25mg   Type of Radiographer, therapeutic Assistance  Name of Prescriber Harvest Lineman  Date Application Received From Patient 02/09/2024  Application Items Received From Patient Application;Proof of Income  Date Application Received From Provider 02/11/2024  Date Application Submitted to Manufacturer 02/11/2024  Method Application Sent to Manufacturer Fax  Patient Assistance Determination Approved  Approval Start Date 03/21/2024  Approval End Date 11/16/2024  Patient Notification Method Telephone Call     Signature

## 2024-03-21 NOTE — Telephone Encounter (Signed)
 PAP: Patient assistance application for Januvia has been approved by PAP Companies: [employer] from 03/21/2024 to 11/16/2024. Medication should be delivered to PAP Delivery: Home. For further shipping updates, please contact [employer] at [phone]. Patient ID is: per patient

## 2024-04-21 ENCOUNTER — Telehealth: Payer: Self-pay | Admitting: Pharmacist

## 2024-04-21 NOTE — Progress Notes (Signed)
 Pharmacy Quality Measure Review  This patient is appearing on a report for being at risk of failing the adherence measure for hypertension (ACEi/ARB) medications this calendar year.   Medication: enalapril  20 mg Last fill date: 12/14/2023 for 90 day supply  Patient reports having overstock of medication on hand due to being on automatic refill in 2024. Patient has cancelled autofill at this time and will call [employer] once she is ready for refill.   Calvert Caul, PharmD Clinical Pharmacist [place] Direct Dial: [phone]

## 2024-05-25 DIAGNOSIS — E1159 Type 2 diabetes mellitus with other circulatory complications: Secondary | ICD-10-CM | POA: Diagnosis not present

## 2024-05-25 DIAGNOSIS — E1169 Type 2 diabetes mellitus with other specified complication: Secondary | ICD-10-CM | POA: Diagnosis not present

## 2024-05-25 DIAGNOSIS — N1831 Chronic kidney disease, stage 3a: Secondary | ICD-10-CM | POA: Diagnosis not present

## 2024-05-25 DIAGNOSIS — Z6824 Body mass index (BMI) 24.0-24.9, adult: Secondary | ICD-10-CM | POA: Diagnosis not present

## 2024-05-25 DIAGNOSIS — M8588 Other specified disorders of bone density and structure, other site: Secondary | ICD-10-CM | POA: Diagnosis not present

## 2024-05-25 DIAGNOSIS — E1121 Type 2 diabetes mellitus with diabetic nephropathy: Secondary | ICD-10-CM | POA: Diagnosis not present

## 2024-05-25 DIAGNOSIS — I152 Hypertension secondary to endocrine disorders: Secondary | ICD-10-CM | POA: Diagnosis not present

## 2024-05-25 DIAGNOSIS — E785 Hyperlipidemia, unspecified: Secondary | ICD-10-CM | POA: Diagnosis not present

## 2024-05-25 DIAGNOSIS — F324 Major depressive disorder, single episode, in partial remission: Secondary | ICD-10-CM | POA: Diagnosis not present

## 2024-07-25 DIAGNOSIS — H524 Presbyopia: Secondary | ICD-10-CM | POA: Diagnosis not present

## 2024-08-30 DIAGNOSIS — E1169 Type 2 diabetes mellitus with other specified complication: Secondary | ICD-10-CM | POA: Diagnosis not present

## 2024-08-30 DIAGNOSIS — E785 Hyperlipidemia, unspecified: Secondary | ICD-10-CM | POA: Diagnosis not present

## 2024-08-30 DIAGNOSIS — Z6825 Body mass index (BMI) 25.0-25.9, adult: Secondary | ICD-10-CM | POA: Diagnosis not present

## 2024-08-30 DIAGNOSIS — Z0000 Encounter for general adult medical examination without abnormal findings: Secondary | ICD-10-CM | POA: Diagnosis not present

## 2024-08-30 DIAGNOSIS — Z1339 Encounter for screening examination for other mental health and behavioral disorders: Secondary | ICD-10-CM | POA: Diagnosis not present

## 2024-08-30 DIAGNOSIS — Z1331 Encounter for screening for depression: Secondary | ICD-10-CM | POA: Diagnosis not present

## 2024-08-30 DIAGNOSIS — E1121 Type 2 diabetes mellitus with diabetic nephropathy: Secondary | ICD-10-CM | POA: Diagnosis not present

## 2024-08-30 DIAGNOSIS — Z23 Encounter for immunization: Secondary | ICD-10-CM | POA: Diagnosis not present

## 2024-08-30 LAB — LAB REPORT - SCANNED
A1c: 6.8
Albumin, Urine POC: 38.1
Albumin/Creatinine Ratio, Urine, POC: 102
Creatinine, POC: 37.5 mg/dL
EGFR: 53

## 2024-09-13 DIAGNOSIS — N202 Calculus of kidney with calculus of ureter: Secondary | ICD-10-CM | POA: Diagnosis not present

## 2024-09-13 DIAGNOSIS — R82994 Hypercalciuria: Secondary | ICD-10-CM | POA: Diagnosis not present

## 2024-09-13 DIAGNOSIS — R399 Unspecified symptoms and signs involving the genitourinary system: Secondary | ICD-10-CM | POA: Diagnosis not present

## 2024-10-10 ENCOUNTER — Telehealth: Payer: Self-pay

## 2024-10-10 NOTE — Telephone Encounter (Signed)
 PAP: Patient assistance application for Januvia through [employer] has been mailed to pt's home address on file. Provider portion of application will be faxed to provider's office.  Provider portion of application has been faxed to Dr, Delon Contes at [facility]

## 2024-10-20 ENCOUNTER — Ambulatory Visit: Attending: Cardiology | Admitting: Cardiology

## 2024-10-20 ENCOUNTER — Encounter: Payer: Self-pay | Admitting: Cardiology

## 2024-10-20 VITALS — BP 120/76 | HR 92 | Ht 63.0 in | Wt 145.2 lb

## 2024-10-20 DIAGNOSIS — E119 Type 2 diabetes mellitus without complications: Secondary | ICD-10-CM

## 2024-10-20 DIAGNOSIS — I10 Essential (primary) hypertension: Secondary | ICD-10-CM | POA: Diagnosis not present

## 2024-10-20 DIAGNOSIS — E782 Mixed hyperlipidemia: Secondary | ICD-10-CM | POA: Diagnosis not present

## 2024-10-20 DIAGNOSIS — I2510 Atherosclerotic heart disease of native coronary artery without angina pectoris: Secondary | ICD-10-CM | POA: Diagnosis not present

## 2024-10-20 NOTE — Progress Notes (Signed)
 Cardiology Office Note:    Date:  10/20/2024   ID:  Karmin Kasprzak, DOB 04/11/1946, MRN [id number]  PCP:  Clemmie Nest, MD  Cardiologist:  Jennifer JONELLE Crape, MD   Referring MD: Clemmie Nest, MD    ASSESSMENT:    1. Coronary artery disease involving native coronary artery of native heart without angina pectoris   2. Essential hypertension   3. Diabetes mellitus type 2, controlled (HCC)   4. Mixed hyperlipidemia    PLAN:    In order of problems listed above:  Coronary artery disease: Secondary prevention stressed with the patient.  Importance of compliance with diet medication stressed and patient verbalized standing.  She is doing very well with diet and exercise.  And I congratulated her about this. Essential hypertension: Blood pressure is stable and diet was emphasized.  Lifestyle modification urged. Mixed dyslipidemia on lipid-lowering medications followed by primary care.  Obtain reports from primary care and lipids are fine.  They are at goal anticoagulated. Diabetes mellitus: Followed by primary care.  Diet emphasized.  She is doing well with this.  She is very compliant. Patient will be seen in follow-up appointment in 6 months or earlier if the patient has any concerns.    Medication Adjustments/Labs and Tests Ordered: Current medicines are reviewed at length with the patient today.  Concerns regarding medicines are outlined above.  Orders Placed This Encounter  Procedures   EKG 12-Lead   No orders of the defined types were placed in this encounter.    No chief complaint on file.    History of Present Illness:    Claudia Christian is a 78 y.o. female.  Patient has past medical history of disease post intervention 2021.  She has history of essential hypertension mixed dyslipidemia and diabetes mellitus.  She denies any problems at this time and takes care of her activities of daily living.  At the time of my evaluation, the patient is alert awake oriented  and in no distress.  She walks on a daily basis.   Past Medical History:  Diagnosis Date   Anemia 11/28/2019   CAD (coronary artery disease) 11/28/2019   Coronary arteriosclerosis 11/28/2019   Coronary artery disease    Diabetes mellitus type 2, controlled (HCC) 2016   Essential hypertension    Hematuria 06/19/2020   History of kidney stones    History of non-ST elevation myocardial infarction (NSTEMI) 11/15/2019   Hydronephrosis with renal and ureteral calculus obstruction 11/15/2019   Hyperlipidemia    Hyponatremia 11/15/2019   Myocardial infarction (HCC) 11/2019   Nephrolithiasis    Sepsis (HCC) 11/15/2019   Staghorn calculus 06/13/2020   Type 2 diabetes mellitus with hyperlipidemia (HCC) 11/15/2019    Past Surgical History:  Procedure Laterality Date   CARDIAC CATHETERIZATION     CORONARY BALLOON ANGIOPLASTY N/A 12/02/2019   Procedure: CORONARY BALLOON ANGIOPLASTY;  Surgeon: Dann Candyce RAMAN, MD;  Location: [facility] INVASIVE CV LAB;  Service: Cardiovascular;  Laterality: N/A;   CORONARY STENT INTERVENTION N/A 12/02/2019   Procedure: CORONARY STENT INTERVENTION;  Surgeon: Dann Candyce RAMAN, MD;  [facility] INV[facility] CV LAB;  Service: Cardiovascular;  Laterality: N/A;   CORONARY ULTRASOUND/IVUS N/A 12/02/2019   Procedure: Intravascular Ultrasound/IVUS;  Surgeon: Dann Candyce RAMAN, MD;  Location: [facility] INVASIVE CV LAB;  Service: Cardiovascular;  Laterality: N/A;   IR NEPHROSTOMY EXCHANGE LEFT  12/28/2019   IR NEPHROSTOMY EXCHANGE LEFT  02/09/2020   IR NEPHROSTOMY EXCHANGE LEFT  03/22/2020   IR NEPHROSTOMY EXCHANGE LEFT  05/03/2020  IR NEPHROSTOMY PLACEMENT LEFT  11/16/2019   LEFT HEART CATH AND CORONARY ANGIOGRAPHY N/A 12/02/2019   Procedure: LEFT HEART CATH AND CORONARY ANGIOGRAPHY;  Surgeon: Dann Candyce RAMAN, MD;  Location: [facility] INVASIVE CV LAB;  Service: Cardiovascular;  Laterality: N/A;   LITHOTRIPSY     NEPHROLITHOTOMY Left 06/13/2020   Procedure: NEPHROLITHOTOMY PERCUTANEOUS;   Surgeon: Alvaro Hummer, MD;  Location: [facility] ORS;  Service: Urology;  Laterality: Left;  3 HRS   TUBAL LIGATION  1984   URETEROSCOPY Left 06/13/2020   Procedure: URETEROSCOPY WITH BASKETING AND STENT PLACEMENT;  Surgeon: Alvaro Hummer, MD;  Location: [facility] ORS;  Service: Urology;  Laterality: Left;    Current Medications: Current Meds  Medication Sig   acetaminophen  (TYLENOL ) 325 MG tablet Take 2 tablets (650 mg total) by mouth every 6 (six) hours as needed for mild pain (or Fever >/= 101).   alendronate (FOSAMAX) 70 MG tablet Take 70 mg by mouth once a week. Take with a full glass of water on an empty stomach.   aspirin  EC 81 MG EC tablet Take 1 tablet (81 mg total) by mouth daily.   atorvastatin  (LIPITOR) 40 MG tablet Take 40 mg by mouth daily.   Cholecalciferol (VITAMIN D-3) 125 MCG (5000 UT) TABS Take 5,000 Units by mouth daily.   enalapril  (VASOTEC ) 20 MG tablet Take 20 mg by mouth daily.   ferrous sulfate  325 (65 FE) MG tablet Take 325 mg by mouth once a week.   fluticasone  (FLONASE ) 50 MCG/ACT nasal spray Place 1-2 sprays into both nostrils daily as needed for allergies or rhinitis.   indapamide (LOZOL) 2.5 MG tablet Take 1.25 mg by mouth daily.   metFORMIN  (GLUCOPHAGE ) 1000 MG tablet Take 1,000 mg by mouth 2 (two) times daily with a meal.   nitroGLYCERIN  (NITROSTAT ) 0.4 MG SL tablet Place 1 tablet (0.4 mg total) under the tongue every 5 (five) minutes as needed for chest pain.   polyethylene glycol (MIRALAX  / GLYCOLAX ) 17 g packet Take 17 g by mouth daily as needed for mild constipation.   sertraline  (ZOLOFT ) 50 MG tablet Take 50 mg by mouth daily.   sitaGLIPtin (JANUVIA) 25 MG tablet Take 25 mg by mouth at bedtime.    vitamin B-12 (CYANOCOBALAMIN ) 1000 MCG tablet Take 1,000 mcg by mouth daily.     Allergies:   Patient has no known allergies.   Social History   Socioeconomic History   Marital status: Married    Spouse name: Not on file   Number of children: Not on file   Years  of education: Not on file   Highest education level: Not on file  Occupational History   Not on file  Tobacco Use   Smoking status: Never   Smokeless tobacco: Never  Vaping Use   Vaping status: Never Used  Substance and Sexual Activity   Alcohol use: Never   Drug use: Never   Sexual activity: Yes    Partners: Male  Other Topics Concern   Not on file  Social History Narrative   Not on file   Social Drivers of Health   Financial Resource Strain: Not on file  Food Insecurity: Not on file  Transportation Needs: Not on file  Physical Activity: Not on file  Stress: Not on file  Social Connections: Not on file     Family History: The patient's family history includes Heart attack in her brother, father, and mother.  ROS:   Please see the history of present illness.  All other systems reviewed and are negative.  EKGs/Labs/Other Studies Reviewed:    The following studies were reviewed today: .SABRAEKG Interpretation Date/Time:  Thursday October 20 2024 13:12:44 EST Ventricular Rate:  92 PR Interval:  148 QRS Duration:  80 QT Interval:  338 QTC Calculation: 417 R Axis:   14  Text Interpretation: Normal sinus rhythm Normal ECG When compared with ECG of 02-Dec-2019 08:49, No significant change was found Confirmed by Edwyna Backers [phone]) on 10/20/2024 1:20:16 PM     Recent Labs: No results found for requested labs within last 365 days.  Recent Lipid Panel    Component Value Date/Time   CHOL 109 05/05/2023 0925   TRIG 110 05/05/2023 0925   HDL 38 (L) 05/05/2023 0925   CHOLHDL 2.9 05/05/2023 0925   LDLCALC 51 05/05/2023 0925    Physical Exam:    VS:  BP 120/76   Pulse 92   Ht 5' 3 (1.6 m)   Wt 145 lb 3.2 oz (65.9 kg)   SpO2 96%   BMI 25.72 kg/m     Wt Readings from Last 3 Encounters:  10/20/24 145 lb 3.2 oz (65.9 kg)  01/22/24 150 lb (68 kg)  05/05/23 151 lb 3.2 oz (68.6 kg)     GEN: Patient is in no acute distress HEENT: Normal NECK: No JVD; No  carotid bruits LYMPHATICS: No lymphadenopathy CARDIAC: Hear sounds regular, 2/6 systolic murmur at the apex. RESPIRATORY:  Clear to auscultation without rales, wheezing or rhonchi  ABDOMEN: Soft, non-tender, non-distended MUSCULOSKELETAL:  No edema; No deformity  SKIN: Warm and dry NEUROLOGIC:  Alert and oriented x 3 PSYCHIATRIC:  Normal affect   Signed, Backers JONELLE Edwyna, MD  10/20/2024 1:23 PM    [facility]

## 2024-10-20 NOTE — Patient Instructions (Signed)
 Medication Instructions:  Your physician recommends that you continue on your current medications as directed. Please refer to the Current Medication list given to you today.  *If you need a refill on your cardiac medications before your next appointment, please call your pharmacy*  Lab Work: NONE If you have labs (blood work) drawn today and your tests are completely normal, you will receive your results only by: MyChart Message (if you have MyChart) OR A paper copy in the mail If you have any lab test that is abnormal or we need to change your treatment, we will call you to review the results.  Testing/Procedures: NONE  Follow-Up: At [facility], you and your health needs are our priority.  As part of our continuing mission to provide you with exceptional heart care, our providers are all part of one team.  This team includes your primary Cardiologist (physician) and Advanced Practice Providers or APPs (Physician Assistants and Nurse Practitioners) who all work together to provide you with the care you need, when you need it.  Your next appointment:   1 year(s)  Provider:   Jennifer Crape, MD    We recommend signing up for the patient portal called MyChart.  Sign up information is provided on this After Visit Summary.  MyChart is used to connect with patients for Virtual Visits (Telemedicine).  Patients are able to view lab/test results, encounter notes, upcoming appointments, etc.  Non-urgent messages can be sent to your provider as well.   To learn more about what you can do with MyChart, go to [email or web address].   Other Instructions

## 2024-10-27 NOTE — Telephone Encounter (Signed)
 Patients application were returned to sender.  Verified mailing address and remailed application

## 2024-11-14 NOTE — Telephone Encounter (Addendum)
 Received patient application in mail

## 2024-11-14 NOTE — Telephone Encounter (Signed)
 PAP: Application for januvia has been submitted to [employer], via fax

## 2024-12-01 NOTE — Telephone Encounter (Signed)
 PAP: Patient assistance application for Januvia has been approved by PAP Companies: [employer] from 11/17/2024 to 11/16/2025. Medication should be delivered to PAP Delivery: Home. For further shipping updates, please contact [employer] at [phone]. Patient ID is: no ID Given   Intiated Refill 1/15
